# Patient Record
Sex: Male | Born: 1960 | Race: White | Marital: Married | Smoking: Never smoker
Health system: Southern US, Community
[De-identification: ages and names within clinical notes are randomized; demographics above are authoritative.]

## PROBLEM LIST (undated history)

## (undated) DIAGNOSIS — D329 Benign neoplasm of meninges, unspecified: Secondary | ICD-10-CM

## (undated) DIAGNOSIS — Z856 Personal history of leukemia: Secondary | ICD-10-CM

## (undated) HISTORY — DX: Benign neoplasm of meninges, unspecified: D32.9

## (undated) HISTORY — PX: CRANIOTOMY: SHX93

## (undated) HISTORY — DX: Personal history of leukemia: Z85.6

---

## 2015-09-05 ENCOUNTER — Other Ambulatory Visit: Payer: Self-pay | Admitting: Radiation Therapy

## 2015-09-05 DIAGNOSIS — D329 Benign neoplasm of meninges, unspecified: Secondary | ICD-10-CM

## 2015-09-10 ENCOUNTER — Encounter: Payer: Self-pay | Admitting: Radiation Therapy

## 2015-09-10 ENCOUNTER — Other Ambulatory Visit: Payer: Self-pay | Admitting: Radiation Therapy

## 2015-09-10 DIAGNOSIS — D329 Benign neoplasm of meninges, unspecified: Secondary | ICD-10-CM

## 2015-09-10 NOTE — Progress Notes (Signed)
1.  Do you need a wheel chair?  no    2. On oxygen? no  3. Have you ever had any surgery in the body part being scanned?  Surgery 1995 in [place]- done by Dr. Rebeca Allegra  4. Have you ever had any surgery on your brain or heart?                             Yes, surgery in 1995 to remove a meningioma listed in #3   5. Have you ever had surgery on your eyes or ears?  no                    6. Do you have a pacemaker or defibrillator?  no   7. Do you have a Neurostimulator?   no  8. Claustrophobic?  no  9. Any risk for metal in eyes?  no  10. Injury by bullet, buckshot, or shrapnel?   no  11. Stent?      no                                                                                                           12. Hx of Cancer?    None- meningioma                                                                                                             13. Kidney or Liver disease?  no  14. Hx of Lupus, Rheumatoid Arthritis or Scleroderma?  no  15. IV Antibiotics or long term use of NSAIDS?  no  16. HX of Hypertension?  no  17. Diabetes?  Yes- takes metformin   18. Allergy to contrast?   none  19. Recent labs. No   Mont Dutton

## 2015-10-04 ENCOUNTER — Encounter: Payer: Self-pay | Admitting: Radiation Oncology

## 2015-10-04 NOTE — Progress Notes (Signed)
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 Growth seen with yearly scan done by Dr. Kathie Dike. Scheduled for next MRI with Dr. Kathie Dike in April 2017.  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 no  Possible current pregnancy? no  Is the patient on methotrexate? no  Additional Complaints / other details: 54 year old male. Childhood leukemia survivor.

## 2015-10-10 ENCOUNTER — Ambulatory Visit
Admission: RE | Admit: 2015-10-10 | Discharge: 2015-10-10 | Disposition: A | Discharge status: Home / Self Care | Payer: 59 | Source: Ambulatory Visit | Attending: Radiation Oncology | Admitting: Radiation Oncology

## 2015-10-10 DIAGNOSIS — D329 Benign neoplasm of meninges, unspecified: Secondary | ICD-10-CM

## 2015-10-10 MED ORDER — GADOBENATE DIMEGLUMINE 529 MG/ML IV SOLN
16.0000 mL | Freq: Once | INTRAVENOUS | Status: AC | PRN
  Administered 2015-10-10: 16 mL via INTRAVENOUS

## 2015-10-11 ENCOUNTER — Ambulatory Visit
Admission: RE | Admit: 2015-10-11 | Discharge: 2015-10-11 | Disposition: A | Discharge status: Home / Self Care | Payer: 59 | Source: Ambulatory Visit | Attending: Radiation Oncology | Admitting: Radiation Oncology

## 2015-10-11 ENCOUNTER — Ambulatory Visit: Payer: Self-pay

## 2015-10-11 ENCOUNTER — Other Ambulatory Visit: Payer: Self-pay | Admitting: Radiation Therapy

## 2015-10-11 ENCOUNTER — Encounter: Payer: Self-pay | Admitting: Radiation Oncology

## 2015-10-11 VITALS — BP 113/76 | HR 81 | Temp 98.2°F | Resp 16 | Ht 67.0 in | Wt 178.1 lb

## 2015-10-11 DIAGNOSIS — Z510 Encounter for antineoplastic radiation therapy: Secondary | ICD-10-CM | POA: Insufficient documentation

## 2015-10-11 DIAGNOSIS — H0520 Unspecified exophthalmos: Secondary | ICD-10-CM | POA: Insufficient documentation

## 2015-10-11 DIAGNOSIS — D320 Benign neoplasm of cerebral meninges: Secondary | ICD-10-CM

## 2015-10-11 DIAGNOSIS — D329 Benign neoplasm of meninges, unspecified: Secondary | ICD-10-CM | POA: Diagnosis present

## 2015-10-11 DIAGNOSIS — Z856 Personal history of leukemia: Secondary | ICD-10-CM | POA: Diagnosis not present

## 2015-10-11 DIAGNOSIS — Z9889 Other specified postprocedural states: Secondary | ICD-10-CM

## 2015-10-11 DIAGNOSIS — E119 Type 2 diabetes mellitus without complications: Secondary | ICD-10-CM

## 2015-10-11 NOTE — Progress Notes (Signed)
Patient has completed and signed authoization to release health information.

## 2015-10-11 NOTE — Progress Notes (Signed)
Radiation Oncology         [phone] ________________________________  Initial outpatient Consultation  Name: John Glover MRN: [id number]  Date: 10/11/2015  DOB: [birth year]  CC:No primary care provider on file.  Zagar, Kris Hartmann, MD   REFERRING PHYSICIAN: Zagar, Kris Hartmann, MD  DIAGNOSIS: The encounter diagnosis was Meningioma [facility]). John Glover is a 54 year-old gentleman with recurrent left sphenoid wing meningioma.    ICD-9-CM ICD-10-CM   1. Meningioma (HCC) 225.2 D32.9     HISTORY OF PRESENT ILLNESS::John Glover is a very nice 54 y.o. 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 In 1995, while he was living in [place] he underwent surgical resection for a left sphenoid wing meningioma. He has never received radiation. He has been monitored with annual MRI. He relocated to [place] in 2007. He did not undergo brain MRI between 2012 and 2015. The patient was noted to have slow progression of his meningioma by Dr. Alycia Patten of neurosurgery at [facility] and referred to Dr. Lianne Cure of radiation oncology at [facility]. The patient was seen by Dr. Lianne Cure on November 11th. Dr. Lianne Cure recommended radiotherapy using fractionation and has the patient has kindly been referred closer to home for treatment in [place].    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    PAST SURGICAL HISTORY: Past Surgical History  Procedure Laterality Date  . Craniotomy      FAMILY HISTORY: family history includes Diabetes in his mother.  SOCIAL HISTORY:  Social History   Social History  . Marital Status: Married    Spouse Name: N/A  . Number of Children: N/A  . Years of Education: N/A   Occupational History  . Not on file.   Social History Main Topics  . Smoking status: Never Smoker   . Smokeless tobacco: Never Used  . Alcohol Use: No  . Drug Use: No  . Sexual  Activity: Yes   Other Topics Concern  . Not on file   Social History Narrative    ALLERGIES: Review of patient's allergies indicates no known allergies.  MEDICATIONS:  Current Outpatient Prescriptions  Medication Sig Dispense Refill  . metFORMIN (GLUCOPHAGE-XR) 500 MG 24 hr tablet TAKE 1 TABLET BY MOUTH DAILY WITH BREAKFAST    . Vitamin D, Ergocalciferol, (DRISDOL) 50000 units CAPS capsule TAKE 1 CAPSULE (50,000 UNITS TOTAL) BY MOUTH ONCE A WEEK.     No current facility-administered medications for this encounter.    REVIEW OF SYSTEMS:  A 15 point review of systems is documented in the electronic medical record. This was obtained by the nursing staff. However, I reviewed this with the patient to discuss relevant findings and make appropriate changes.  Pertinent items are noted in HPI.   When the patient first noticed his left eye bulging he visited his eye doctor. The bulging is unchanged after surgery. Denies double vision. The patient has no complaints at this time. The patient would prefer to begin radiation treatment after the 15th of January.   PHYSICAL EXAM:  height is 5\' 7"  (1.702 m) and weight is 178 lb 1.6 oz (80.786 kg). His oral temperature is 98.2 F (36.8 C). His blood pressure is 113/76 and his pulse is 81. His respiration is 16 and oxygen saturation is 100%.   per [facility] rad-onc:  General: Well developed, well nourished male in no acute distress HEENT: Pupils equal , round  and reactive. Extra occular movement intact. Slight proptosis on the left side Neuro: CN II-XII intact, AAOx3, gait stable, No dysmetria on exam MSK: Full range of motion in all 4 extremities, normal bulk and tone SKIN:No bruises or rash. Psych: Appropriate mood and affect I concur and would add Left eye proptosis noted. Extraocular muscles intact.  KPS = 90  100 - Normal; no complaints; no evidence of disease. 90   - Able to carry on normal activity; minor signs or symptoms of disease. 80   -  Normal activity with effort; some signs or symptoms of disease. 2   - Cares for self; unable to carry on normal activity or to do active work. 60   - Requires occasional assistance, but is able to care for most of his personal needs. 50   - Requires considerable assistance and frequent medical care. 5   - Disabled; requires special care and assistance. 76   - Severely disabled; hospital admission is indicated although death not imminent. 59   - Very sick; hospital admission necessary; active supportive treatment necessary. 10   - Moribund; fatal processes progressing rapidly. 0     - Dead  Karnofsky DA, Abelmann WH, Craver LS and Burchenal JH [phone]) The use of the nitrogen mustards in the palliative treatment of carcinoma: with particular reference to bronchogenic carcinoma Cancer 1 634-56  LABORATORY DATA:  No results found for: WBC, HGB, HCT, MCV, PLT No results found for: NA, K, CL, CO2 No results found for: ALT, AST, GGT, ALKPHOS, BILITOT   RADIOGRAPHY: Mr Kizzie Fantasia Contrast  10/10/2015  CLINICAL DATA:  54 year old male status post meningioma resection in 2005 with subsequent growth documented on outside [facility] MRI. Planning study for stereotactic radiosurgery treatment. Subsequent encounter. Creatinine was obtained on site at [facility] at [address] Results: Creatinine 1.0 mg/dL. EXAM: MRI HEAD WITHOUT AND WITH CONTRAST TECHNIQUE: Multiplanar, multiecho pulse sequences of the brain and surrounding structures were obtained without and with intravenous contrast. CONTRAST:  87mL MULTIHANCE GADOBENATE DIMEGLUMINE 529 MG/ML IV SOLN COMPARISON:  [facility] MRI brain report 01/31/2015 (no images available). FINDINGS: Left frontotemporal craniotomy changes. Abnormal enhancing, lobular soft tissue extending from the left sphenoid wing medially into the left cavernous sinus and sphenoid paranasal sinus, crossing midline on series 10, image 49. There is involvement are row on the left  orbital apex (best seen on series 10, image 62), as well as extension into the left infratemporal fossa and masticator space. The lesion tracks along the lateral left orbit inseparable from the left lateral rectus muscle (see series 8, image 60 and series 10, image 61). There is left exophthalmos. There is mild stenosis at the left orbital apex. There is lobular anterior extension also suspected along the medial roof of the left maxillary sinus (series 10, image 49 and series 11, image 42) where solid hyper enhancement is different than the surrounding tram track mucosal hyper enhancement pattern related to inflammatory sinus disease. Previous maxillary antrostomy. There is also tumor extension in the left foramen ovale along the 3 (series 10, image 44), partially inseparable from the left ICA siphon. Partially empty sella appearance. Meningioma does extend toward the midline in the sella turcica (series 11, image 30). Tumor extension into the left clivus best seen on series 2, image 22. All told, the tumor encompasses 62 x 65 x 48 mm (AP by transverse by CC). There is superimposed smooth, mild dural thickening and hyper enhancement extending cephalad from the tumor along  the left anterior and lateral convexity (series 10, images 74-85). Major intracranial vascular flow voids are preserved. There is mild mass effect on the left anterior temporal tip, no associated cerebral edema. Mild encephalomalacia in the left greater than right anterior frontal lobes 7, image 15. No other intracranial mass effect. No restricted diffusion or evidence of acute infarction. No midline shift or ventriculomegaly. No acute intracranial hemorrhage identified. There are occasional chronic micro hemorrhages in the brain (posterior right temporal lobe series 6, image 14 and left frontal lobe series 6, image 18). Normal basilar cisterns. Negative cervicomedullary junction and visualized cervical spine. Visible internal auditory structures  appear normal. Mastoids are clear. Superimposed bilateral paranasal sinus mucosal thickening predominantly in the maxillary sinuses. Previous maxillary antrostomies. Negative visualized cervical spinal cord. No acute superficial scalp soft tissue findings. IMPRESSION: 1. Extensive, infiltrating left skullbase meningioma. Extension across midline at the level of the sphenoid paranasal sinus, extension into the left clivus, left foramen ovale, and left masticator space, extension along the left lateral orbit and probably also along the roof of the left maxillary sinus. Tumor overall encompasses 62 x 65 x 48 mm. 2. Superimposed smooth dural thickening and enhancement extending cephalad from the left middle cranial fossa. Overlying previous craniotomy changes. 3. Mild mass effect on the left anterior temporal tip with no cerebral edema identified. Mild postoperative anterior frontal lobe encephalomalacia. Electronically Signed   By: Genevie Ann M.D.   On: 10/10/2015 18:02      IMPRESSION: John Glover is a very nice 54 year-old gentleman with recurrent left sphenoid wing meningioma. The patient may benefit from radiotherapy. Given the size of the target and proximity to optic pathway, the patient would not be a good candidate for single fraction SRS or even hypo-fractionated stereotactic radiation. I would recommend 50.4-52.2 Gy in 28-29 fractions using stereotactic techniques with conventional fractionation.  PLAN: Today, I talked to the patient and family about the findings and work-up thus far.  We discussed the natural history of sphenoid wing meningioma and general treatment, highlighting the role of radiotherapy in the management.  We discussed the available radiation techniques, and focused on the details of logistics and delivery.  We reviewed the anticipated acute and late sequelae associated with radiation in this setting.  The patient was encouraged to ask questions that I answered to the best of my ability.   The patient would like to proceed with radiation and has been scheduled for simulation tomorrow, 12/30.  The patient would prefer to begin radiation treatment after the 15th of January.  I spent 60 minutes minutes face to face with the patient and more than 50% of that time was spent in counseling and/or coordination of care.   ------------------------------------------------  Sheral Apley. Tammi Klippel, M.D.   This document serves as a record of services personally performed by Tyler Pita, MD. It was created on his behalf by Arlyce Harman, a trained medical scribe. The creation of this record is based on the scribe's personal observations and the provider's statements to them. This document has been checked and approved by the attending provider.

## 2015-10-11 NOTE — Progress Notes (Signed)
See progress note under physician encounter. 

## 2015-10-12 ENCOUNTER — Ambulatory Visit
Admission: RE | Admit: 2015-10-12 | Discharge: 2015-10-12 | Disposition: A | Discharge status: Home / Self Care | Payer: 59 | Source: Ambulatory Visit | Attending: Radiation Oncology | Admitting: Radiation Oncology

## 2015-10-12 VITALS — BP 113/76 | HR 85 | Resp 16 | Wt 178.5 lb

## 2015-10-12 DIAGNOSIS — D329 Benign neoplasm of meninges, unspecified: Secondary | ICD-10-CM

## 2015-10-12 DIAGNOSIS — D320 Benign neoplasm of cerebral meninges: Secondary | ICD-10-CM

## 2015-10-12 DIAGNOSIS — Z510 Encounter for antineoplastic radiation therapy: Secondary | ICD-10-CM | POA: Diagnosis not present

## 2015-10-12 LAB — BUN AND CREATININE (CC13)
BUN: 15.5 mg/dL (ref 7.0–26.0)
Creatinine: 1.2 mg/dL (ref 0.7–1.3)
EGFR: 69 mL/min/{1.73_m2} — ABNORMAL LOW (ref >90)

## 2015-10-12 NOTE — Progress Notes (Signed)
  Radiation Oncology         [phone] ________________________________  Name: John Glover MRN: [id number]  Date: 10/12/2015  DOB: 26-Oct-1960  SIMULATION AND TREATMENT PLANNING NOTE    ICD-9-CM ICD-10-CM   1. Left sphenoid wing meningioma involving cavernous sinus (HCC) 225.2 D32.9     DIAGNOSIS:  54 yo man with a recurrent over 6 cm left sphenoid wing meningioma  NARRATIVE:  The patient was brought to the [place].  Identity was confirmed.  All relevant records and images related to the planned course of therapy were reviewed.  The patient freely provided informed written consent to proceed with treatment after reviewing the details related to the planned course of therapy. The consent form was witnessed and verified by the simulation staff. Intravenous access was established for contrast administration. Then, the patient was set-up in a stable reproducible supine position for radiation therapy.  A relocatable thermoplastic stereotactic head frame was fabricated for precise immobilization.  CT images were obtained.  Surface markings were placed.  The CT images were loaded into the planning software and fused with the patient's targeting MRI scan.  Then the target and avoidance structures were contoured.  Treatment planning then occurred.  The radiation prescription was entered and confirmed.  I have requested 3D planning  I have requested a DVH of the following structures: Brain stem, brain, left eye, right eye, lenses, optic chiasm, target volumes, uninvolved brain, and normal tissue.    PLAN:  The patient will receive 52.2 Gy in 29 fraction.  ________________________________  Sheral Apley Tammi Klippel, M.D.

## 2015-10-16 ENCOUNTER — Telehealth: Payer: Self-pay | Admitting: Radiation Oncology

## 2015-10-16 ENCOUNTER — Ambulatory Visit
Admission: RE | Admit: 2015-10-16 | Discharge: 2015-10-16 | Disposition: A | Discharge status: Home / Self Care | Payer: 59 | Source: Ambulatory Visit | Attending: Radiation Oncology | Admitting: Radiation Oncology

## 2015-10-16 DIAGNOSIS — D320 Benign neoplasm of cerebral meninges: Secondary | ICD-10-CM

## 2015-10-16 DIAGNOSIS — Z510 Encounter for antineoplastic radiation therapy: Secondary | ICD-10-CM | POA: Diagnosis not present

## 2015-10-16 LAB — BUN AND CREATININE (CC13)
BUN: 15.6 mg/dL (ref 7.0–26.0)
Creatinine: 1.1 mg/dL (ref 0.7–1.3)
EGFR: 74 mL/min/{1.73_m2} — ABNORMAL LOW (ref >90)

## 2015-10-16 MED ORDER — SODIUM CHLORIDE 0.9 % IJ SOLN
10.0000 mL | Freq: Once | INTRAMUSCULAR | Status: AC
  Administered 2015-10-16: 10 mL via INTRAVENOUS

## 2015-10-16 NOTE — Progress Notes (Signed)
Received patient in the clinic for IV start necessary for [facility] simulation. BUN 24 and creatinine 0.9. Patient denies taking Metformin today. Patient verbalized understanding to not resume Metformin until after subsequent lab drawn on Tuesday, January 3rd. Started right AC 22 gauge IV on first attempt. Site flushed without difficulty and excellent blood return obtained. Secured IV in place. Escorted patient to CT for simulation. Following simulation IV was removed by Mont Dutton, RT. Manuela Schwartz reports the IV catheter was intact upon removal and she applied an occlusive dressing to the old site. Manuela Schwartz reports the patient tolerated this well.

## 2015-10-16 NOTE — Telephone Encounter (Signed)
Phoned patient making him aware his BUN and creatinine are WDL and he may resume his Metformin. Patient verbalized understanding and expressed appreciation for the call.

## 2015-10-22 ENCOUNTER — Other Ambulatory Visit: Payer: Self-pay | Admitting: Radiation Therapy

## 2015-10-22 DIAGNOSIS — D329 Benign neoplasm of meninges, unspecified: Secondary | ICD-10-CM

## 2015-10-26 ENCOUNTER — Other Ambulatory Visit: Payer: Self-pay | Admitting: Radiation Oncology

## 2015-10-28 ENCOUNTER — Ambulatory Visit
Admission: RE | Admit: 2015-10-28 | Discharge: 2015-10-28 | Disposition: A | Discharge status: Home / Self Care | Payer: 59 | Source: Ambulatory Visit | Attending: Radiation Oncology | Admitting: Radiation Oncology

## 2015-10-28 ENCOUNTER — Other Ambulatory Visit: Payer: 59

## 2015-10-28 DIAGNOSIS — D329 Benign neoplasm of meninges, unspecified: Secondary | ICD-10-CM

## 2015-10-28 MED ORDER — GADOBENATE DIMEGLUMINE 529 MG/ML IV SOLN
15.0000 mL | Freq: Once | INTRAVENOUS | Status: AC | PRN
  Administered 2015-10-28: 15 mL via INTRAVENOUS

## 2015-10-29 ENCOUNTER — Ambulatory Visit: Payer: 59 | Admitting: Radiation Oncology

## 2015-10-29 ENCOUNTER — Ambulatory Visit: Admission: RE | Admit: 2015-10-29 | Payer: 59 | Source: Ambulatory Visit | Admitting: Radiation Oncology

## 2015-10-30 ENCOUNTER — Ambulatory Visit: Payer: 59

## 2015-10-31 ENCOUNTER — Ambulatory Visit: Payer: 59

## 2015-10-31 DIAGNOSIS — Z510 Encounter for antineoplastic radiation therapy: Secondary | ICD-10-CM | POA: Diagnosis not present

## 2015-11-01 ENCOUNTER — Ambulatory Visit
Admission: RE | Admit: 2015-11-01 | Discharge: 2015-11-01 | Disposition: A | Discharge status: Home / Self Care | Payer: 59 | Source: Ambulatory Visit | Attending: Radiation Oncology | Admitting: Radiation Oncology

## 2015-11-01 VITALS — BP 112/80 | HR 65 | Resp 16 | Wt 179.1 lb

## 2015-11-01 DIAGNOSIS — Z510 Encounter for antineoplastic radiation therapy: Secondary | ICD-10-CM | POA: Diagnosis not present

## 2015-11-01 DIAGNOSIS — D329 Benign neoplasm of meninges, unspecified: Secondary | ICD-10-CM

## 2015-11-01 NOTE — Progress Notes (Signed)
  Radiation Oncology         [phone]   Name: John Glover MRN: [id number]   Date: 11/01/2015  DOB: 03-14-1961   Weekly Radiation Therapy Management    ICD-9-CM ICD-10-CM   1. Left sphenoid wing meningioma involving cavernous sinus (HCC) 225.2 D32.9     Current Dose: 1.8 Gy  Planned Dose:  54 Gy  Narrative The patient presents for routine under treatment assessment.  Weight and vitals stable. Denies pain. Reports experiencing a severe headache during treatment but, relates this to the mask. Denies nausea or vomiting. Denies diplopia or blurry vision. Left eye more prominent than right. Drooping of left eyelid continues. No skin changes within treatment field. Denies fatigue.      The patient is without complaint. Set-up films were reviewed. The chart was checked.  Physical Findings  weight is 179 lb 1.6 oz (81.239 kg). His blood pressure is 112/80 and his pulse is 65. His respiration is 16 and oxygen saturation is 100%. . Weight essentially stable.  No significant changes.  Impression The patient is tolerating radiation.  Plan Continue treatment as planned.       Sheral Apley Tammi Klippel, M.D.  This document serves as a record of services personally performed by Tyler Pita, MD. It was created on his behalf by Jenell Milliner, a trained medical scribe. The creation of this record is based on the scribe's personal observations and the provider's statements to them. This document has been checked and approved by the attending provider.

## 2015-11-01 NOTE — Progress Notes (Addendum)
Weight and vitals stable. Denies pain. Reports experiencing a severe headache during treatment but, relates this to the mask. Denies nausea or vomiting. Denies diplopia or blurry vision. Left eye more prominent than right. Drooping of left eyelid continues. No skin changes within treatment field. Denies fatigue. Oriented patient to staff and routine of the clinic. Provided patient with RADIATION THERAPY AND YOU handbook then, reviewed pertinent information. Educated patient reference potential side effects and management. Answered all patient questions to the best of my ability. Provided patient with my business card and encouraged him to call with needs. Patient verbalized understanding of all reviewed.   BP 112/80 mmHg  Pulse 65  Resp 16  Wt 179 lb 1.6 oz (81.239 kg)  SpO2 100% Wt Readings from Last 3 Encounters:  11/01/15 179 lb 1.6 oz (81.239 kg)  10/12/15 178 lb 8 oz (80.967 kg)  10/11/15 178 lb 1.6 oz (80.786 kg)

## 2015-11-02 ENCOUNTER — Ambulatory Visit
Admission: RE | Admit: 2015-11-02 | Discharge: 2015-11-02 | Disposition: A | Discharge status: Home / Self Care | Payer: 59 | Source: Ambulatory Visit | Attending: Radiation Oncology | Admitting: Radiation Oncology

## 2015-11-02 DIAGNOSIS — Z510 Encounter for antineoplastic radiation therapy: Secondary | ICD-10-CM | POA: Diagnosis not present

## 2015-11-05 ENCOUNTER — Ambulatory Visit
Admission: RE | Admit: 2015-11-05 | Discharge: 2015-11-05 | Disposition: A | Discharge status: Home / Self Care | Payer: 59 | Source: Ambulatory Visit | Attending: Radiation Oncology | Admitting: Radiation Oncology

## 2015-11-05 ENCOUNTER — Ambulatory Visit: Payer: 59

## 2015-11-06 ENCOUNTER — Telehealth (HOSPITAL_COMMUNITY): Payer: Self-pay

## 2015-11-06 ENCOUNTER — Ambulatory Visit
Admission: RE | Admit: 2015-11-06 | Discharge: 2015-11-06 | Disposition: A | Discharge status: Home / Self Care | Payer: 59 | Source: Ambulatory Visit | Attending: Radiation Oncology | Admitting: Radiation Oncology

## 2015-11-06 DIAGNOSIS — Z510 Encounter for antineoplastic radiation therapy: Secondary | ICD-10-CM | POA: Diagnosis not present

## 2015-11-06 NOTE — Telephone Encounter (Signed)
11/06/15        Patient called and left msg. cancelling Dental Consult w/Dr. Enrique Sack on 11/07/15 @ 10:30 due to work schedule.  Patient will call back later next week to reschedule for another appt. date.   LRI

## 2015-11-07 ENCOUNTER — Other Ambulatory Visit (HOSPITAL_COMMUNITY): Payer: 59 | Admitting: Dentistry

## 2015-11-07 ENCOUNTER — Ambulatory Visit
Admission: RE | Admit: 2015-11-07 | Discharge: 2015-11-07 | Disposition: A | Discharge status: Home / Self Care | Payer: 59 | Source: Ambulatory Visit | Attending: Radiation Oncology | Admitting: Radiation Oncology

## 2015-11-07 DIAGNOSIS — Z510 Encounter for antineoplastic radiation therapy: Secondary | ICD-10-CM | POA: Diagnosis not present

## 2015-11-08 ENCOUNTER — Ambulatory Visit
Admission: RE | Admit: 2015-11-08 | Discharge: 2015-11-08 | Disposition: A | Discharge status: Home / Self Care | Payer: 59 | Source: Ambulatory Visit | Attending: Radiation Oncology | Admitting: Radiation Oncology

## 2015-11-08 ENCOUNTER — Encounter: Payer: Self-pay | Admitting: Radiation Oncology

## 2015-11-08 VITALS — BP 116/87 | HR 89 | Temp 98.7°F | Resp 16 | Wt 176.5 lb

## 2015-11-08 DIAGNOSIS — Z510 Encounter for antineoplastic radiation therapy: Secondary | ICD-10-CM | POA: Diagnosis not present

## 2015-11-08 DIAGNOSIS — D329 Benign neoplasm of meninges, unspecified: Secondary | ICD-10-CM

## 2015-11-08 NOTE — Progress Notes (Signed)
wekly rad txs brain 5/30 completed, no nausea, head aches or vision changes , mask tight and  still hits his tooth, left side face slightly swollen,left eye reddened, no pain, no fatigue,. Asked aboput referral to Dr. Enrique Sack office, not taking dexamethasone BP 116/87 mmHg  Pulse 89  Temp(Src) 98.7 F (37.1 C) (Oral)  Resp 16  Wt 176 lb 8 oz (80.06 kg)  SpO2 100%  Wt Readings from Last 3 Encounters:  11/08/15 176 lb 8 oz (80.06 kg)  11/01/15 179 lb 1.6 oz (81.239 kg)  10/12/15 178 lb 8 oz (80.967 kg)  10:14 AM

## 2015-11-08 NOTE — Progress Notes (Signed)
  Radiation Oncology         [phone]   Name: John Glover MRN: [id number]   Date: 11/08/2015  DOB: 03-Aug-1961   Weekly Radiation Therapy Management    ICD-9-CM ICD-10-CM   1. Left sphenoid wing meningioma involving cavernous sinus (HCC) 225.2 D32.9     Current Dose: 9 Gy  Planned Dose:  54 Gy  Narrative The patient presents for routine under treatment assessment.  Patient reports slight warmth, redness, and swelling on the left side of the face and eye. Also reports that mask is tight and still hits his tooth. Denies pain, fatigue, nausea, headaches, and vision changes. Asked about referral to Dr. Ritta Slot office. Patient is not taking dexamethasone  Set-up films were reviewed. The chart was checked.  Physical Findings  weight is 176 lb 8 oz (80.06 kg). His oral temperature is 98.7 F (37.1 C). His blood pressure is 116/87 and his pulse is 89. His respiration is 16 and oxygen saturation is 100%.  Weight essentially stable.  No significant changes.  Impression The patient is tolerating radiation.  Plan Continue treatment as planned. Will refer to Dr. Enrique Sack.      Sheral Apley Tammi Klippel, M.D.  This document serves as a record of services personally performed by Tyler Pita, MD. It was created on his behalf by Jenell Milliner, a trained medical scribe. The creation of this record is based on the scribe's personal observations and the provider's statements to them. This document has been checked and approved by the attending provider.

## 2015-11-09 ENCOUNTER — Ambulatory Visit
Admission: RE | Admit: 2015-11-09 | Discharge: 2015-11-09 | Disposition: A | Discharge status: Home / Self Care | Payer: 59 | Source: Ambulatory Visit | Attending: Radiation Oncology | Admitting: Radiation Oncology

## 2015-11-09 DIAGNOSIS — Z510 Encounter for antineoplastic radiation therapy: Secondary | ICD-10-CM | POA: Diagnosis not present

## 2015-11-12 ENCOUNTER — Ambulatory Visit
Admission: RE | Admit: 2015-11-12 | Discharge: 2015-11-12 | Disposition: A | Discharge status: Home / Self Care | Payer: 59 | Source: Ambulatory Visit | Attending: Radiation Oncology | Admitting: Radiation Oncology

## 2015-11-12 DIAGNOSIS — Z510 Encounter for antineoplastic radiation therapy: Secondary | ICD-10-CM | POA: Diagnosis not present

## 2015-11-13 ENCOUNTER — Ambulatory Visit
Admission: RE | Admit: 2015-11-13 | Discharge: 2015-11-13 | Disposition: A | Discharge status: Home / Self Care | Payer: 59 | Source: Ambulatory Visit | Attending: Radiation Oncology | Admitting: Radiation Oncology

## 2015-11-13 DIAGNOSIS — Z510 Encounter for antineoplastic radiation therapy: Secondary | ICD-10-CM | POA: Diagnosis not present

## 2015-11-14 ENCOUNTER — Ambulatory Visit
Admission: RE | Admit: 2015-11-14 | Discharge: 2015-11-14 | Disposition: A | Discharge status: Home / Self Care | Payer: 59 | Source: Ambulatory Visit | Attending: Radiation Oncology | Admitting: Radiation Oncology

## 2015-11-14 DIAGNOSIS — Z510 Encounter for antineoplastic radiation therapy: Secondary | ICD-10-CM | POA: Diagnosis not present

## 2015-11-15 ENCOUNTER — Ambulatory Visit
Admission: RE | Admit: 2015-11-15 | Discharge: 2015-11-15 | Disposition: A | Discharge status: Home / Self Care | Payer: 59 | Source: Ambulatory Visit | Attending: Radiation Oncology | Admitting: Radiation Oncology

## 2015-11-15 DIAGNOSIS — Z510 Encounter for antineoplastic radiation therapy: Secondary | ICD-10-CM | POA: Diagnosis not present

## 2015-11-15 NOTE — Addendum Note (Signed)
Encounter addended by: Heywood Footman, RN on: 11/15/2015 10:21 AM<BR>     Documentation filed: Notes Section, Chief Complaint Section

## 2015-11-16 ENCOUNTER — Ambulatory Visit
Admission: RE | Admit: 2015-11-16 | Discharge: 2015-11-16 | Disposition: A | Discharge status: Home / Self Care | Payer: 59 | Source: Ambulatory Visit | Attending: Radiation Oncology | Admitting: Radiation Oncology

## 2015-11-16 VITALS — BP 110/79 | HR 71 | Resp 16 | Wt 175.8 lb

## 2015-11-16 DIAGNOSIS — D329 Benign neoplasm of meninges, unspecified: Secondary | ICD-10-CM | POA: Insufficient documentation

## 2015-11-16 DIAGNOSIS — Z510 Encounter for antineoplastic radiation therapy: Secondary | ICD-10-CM | POA: Insufficient documentation

## 2015-11-16 MED ORDER — SONAFINE EX EMUL
1.0000 "application " | Freq: Two times a day (BID) | CUTANEOUS | Status: DC
  Administered 2015-11-16: 1 via TOPICAL
  Filled 2015-11-16: qty 45

## 2015-11-16 NOTE — Progress Notes (Signed)
Weight and vitals stable. Denies pain. Reports following radiation treatment most days he experiences a headache. He attributes this to the tight fit of the treatment mask. Reports he manages these headaches with motrin. Reports new onset of taste changes. Reports tearing from his left eye. Hyperpigmentation without desquamation left cheek. Provided patient with Sonafine cream and directed upon use. Denies dizziness or ringing in his ears. Reports fatigue. Denies taking decadron.   BP 110/79 mmHg  Pulse 71  Resp 16  Wt 175 lb 12.8 oz (79.742 kg)  SpO2 100% Wt Readings from Last 3 Encounters:  11/16/15 175 lb 12.8 oz (79.742 kg)  11/08/15 176 lb 8 oz (80.06 kg)  11/01/15 179 lb 1.6 oz (81.239 kg)

## 2015-11-16 NOTE — Progress Notes (Signed)
  Radiation Oncology         [phone]   Name: John Glover MRN: [id number]   Date: 11/16/2015  DOB: [birth year]     Weekly Radiation Therapy Management    ICD-9-CM ICD-10-CM   1. Left sphenoid wing meningioma involving cavernous sinus (HCC) 225.2 D32.9 DISCONTINUED: SONAFINE emulsion 1 application    Current Dose: 19.8 Gy  Planned Dose:  54 Gy  Narrative The patient presents for routine under treatment assessment.  Weight and vitals stable. Denies pain. Reports following radiation treatment most days he experiences a headache. He attributes this to the tight fit of the treatment mask. Reports he manages these headaches with motrin. Reports new onset of taste changes, decreased taste of salt or sweet. Reports tearing from his left eye. Hyperpigmentation without desquamation left cheek. Provided patient with Sonafine cream and directed upon use. Denies dizziness or ringing in his ears. Reports fatigue. Denies taking decadron. Reports slight dry mouth.  Set-up films were reviewed. The chart was checked.  Physical Findings  weight is 175 lb 12.8 oz (79.742 kg). His blood pressure is 110/79 and his pulse is 71. His respiration is 16 and oxygen saturation is 100%.  Weight essentially stable.  No significant changes. No thrush noted.  Impression The patient is tolerating radiation.  Plan Continue treatment as planned.       Sheral Apley Tammi Klippel, M.D.  This document serves as a record of services personally performed by Tyler Pita, MD. It was created on his behalf by Arlyce Harman, a trained medical scribe. The creation of this record is based on the scribe's personal observations and the provider's statements to them. This document has been checked and approved by the attending provider.

## 2015-11-19 ENCOUNTER — Ambulatory Visit
Admission: RE | Admit: 2015-11-19 | Discharge: 2015-11-19 | Disposition: A | Discharge status: Home / Self Care | Payer: 59 | Source: Ambulatory Visit | Attending: Radiation Oncology | Admitting: Radiation Oncology

## 2015-11-19 DIAGNOSIS — Z510 Encounter for antineoplastic radiation therapy: Secondary | ICD-10-CM | POA: Diagnosis not present

## 2015-11-20 ENCOUNTER — Ambulatory Visit
Admission: RE | Admit: 2015-11-20 | Discharge: 2015-11-20 | Disposition: A | Discharge status: Home / Self Care | Payer: 59 | Source: Ambulatory Visit | Attending: Radiation Oncology | Admitting: Radiation Oncology

## 2015-11-20 DIAGNOSIS — Z510 Encounter for antineoplastic radiation therapy: Secondary | ICD-10-CM | POA: Diagnosis not present

## 2015-11-21 ENCOUNTER — Ambulatory Visit
Admission: RE | Admit: 2015-11-21 | Discharge: 2015-11-21 | Disposition: A | Discharge status: Home / Self Care | Payer: 59 | Source: Ambulatory Visit | Attending: Radiation Oncology | Admitting: Radiation Oncology

## 2015-11-21 DIAGNOSIS — Z510 Encounter for antineoplastic radiation therapy: Secondary | ICD-10-CM | POA: Diagnosis not present

## 2015-11-22 ENCOUNTER — Ambulatory Visit
Admission: RE | Admit: 2015-11-22 | Discharge: 2015-11-22 | Disposition: A | Discharge status: Home / Self Care | Payer: 59 | Source: Ambulatory Visit | Attending: Radiation Oncology | Admitting: Radiation Oncology

## 2015-11-22 VITALS — BP 119/74 | HR 71 | Resp 16 | Wt 172.5 lb

## 2015-11-22 DIAGNOSIS — D329 Benign neoplasm of meninges, unspecified: Secondary | ICD-10-CM

## 2015-11-22 DIAGNOSIS — Z510 Encounter for antineoplastic radiation therapy: Secondary | ICD-10-CM | POA: Diagnosis not present

## 2015-11-22 NOTE — Progress Notes (Signed)
Vitals stable. Weight loss noted associated with lack of appetite from taste changes. Denies pain. Hyperpigmentation without desquamation noted surrounding left eye. Reports left eye dryness. Encouraged patient to instill Genteal OTC. Reports headaches s/p radiation have subsided. Alopecia at hair line mild. Denies dizziness or ringing in his ears. Reports fatigue.   BP 119/74 mmHg  Pulse 71  Resp 16  Wt 172 lb 8 oz (78.245 kg)  SpO2 100% Wt Readings from Last 3 Encounters:  11/22/15 172 lb 8 oz (78.245 kg)  11/16/15 175 lb 12.8 oz (79.742 kg)  11/08/15 176 lb 8 oz (80.06 kg)

## 2015-11-22 NOTE — Progress Notes (Signed)
  Radiation Oncology         [phone]   Name: John Glover MRN: [id number]   Date: 11/22/2015  DOB: 10/03/1961     Weekly Radiation Therapy Management    ICD-9-CM ICD-10-CM   1. 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  Vitals stable. Weight loss noted associated with lack of appetite from taste changes. Denies pain. Hyperpigmentation without desquamation noted surrounding left eye. Reports left eye dryness. Reports headaches s/p radiation have subsided. Alopecia at hair line mild. Denies dizziness or ringing in his ears. Reports fatigue.   Set-up films were reviewed. The chart was checked.  Physical Findings  weight is 172 lb 8 oz (78.245 kg). His blood pressure is 119/74 and his pulse is 71. His respiration is 16 and oxygen saturation is 100%.  Weight essentially stable.  No significant changes. No thrush noted.  Impression The patient is tolerating radiation.  Plan Continue treatment as planned.  Encouraged patient to instill Genteal OTC.    Sheral Apley Tammi Klippel, M.D.  This document serves as a record of services personally performed by Tyler Pita, MD. It was created on his behalf by Derek Mound, a trained medical scribe. The creation of this record is based on the scribe's personal observations and the provider's statements to them. This document has been checked and approved by the attending provider.

## 2015-11-23 ENCOUNTER — Ambulatory Visit
Admission: RE | Admit: 2015-11-23 | Discharge: 2015-11-23 | Disposition: A | Discharge status: Home / Self Care | Payer: 59 | Source: Ambulatory Visit | Attending: Radiation Oncology | Admitting: Radiation Oncology

## 2015-11-23 DIAGNOSIS — Z510 Encounter for antineoplastic radiation therapy: Secondary | ICD-10-CM | POA: Diagnosis not present

## 2015-11-26 ENCOUNTER — Ambulatory Visit
Admission: RE | Admit: 2015-11-26 | Discharge: 2015-11-26 | Disposition: A | Discharge status: Home / Self Care | Payer: 59 | Source: Ambulatory Visit | Attending: Radiation Oncology | Admitting: Radiation Oncology

## 2015-11-26 DIAGNOSIS — Z510 Encounter for antineoplastic radiation therapy: Secondary | ICD-10-CM | POA: Diagnosis not present

## 2015-11-27 ENCOUNTER — Ambulatory Visit
Admission: RE | Admit: 2015-11-27 | Discharge: 2015-11-27 | Disposition: A | Discharge status: Home / Self Care | Payer: 59 | Source: Ambulatory Visit | Attending: Radiation Oncology | Admitting: Radiation Oncology

## 2015-11-27 DIAGNOSIS — Z510 Encounter for antineoplastic radiation therapy: Secondary | ICD-10-CM | POA: Diagnosis not present

## 2015-11-28 ENCOUNTER — Ambulatory Visit
Admission: RE | Admit: 2015-11-28 | Discharge: 2015-11-28 | Disposition: A | Discharge status: Home / Self Care | Payer: 59 | Source: Ambulatory Visit | Attending: Radiation Oncology | Admitting: Radiation Oncology

## 2015-11-28 DIAGNOSIS — Z510 Encounter for antineoplastic radiation therapy: Secondary | ICD-10-CM | POA: Diagnosis not present

## 2015-11-29 ENCOUNTER — Ambulatory Visit
Admission: RE | Admit: 2015-11-29 | Discharge: 2015-11-29 | Disposition: A | Discharge status: Home / Self Care | Payer: 59 | Source: Ambulatory Visit | Attending: Radiation Oncology | Admitting: Radiation Oncology

## 2015-11-29 VITALS — BP 106/71 | HR 73 | Temp 98.4°F | Resp 18 | Wt 169.3 lb

## 2015-11-29 DIAGNOSIS — D329 Benign neoplasm of meninges, unspecified: Secondary | ICD-10-CM

## 2015-11-29 DIAGNOSIS — Z510 Encounter for antineoplastic radiation therapy: Secondary | ICD-10-CM | POA: Diagnosis not present

## 2015-11-29 NOTE — Progress Notes (Signed)
Weekly rad tx brain txs left side face  20/30 completed,   Left side face erythema dry and flaky, using radiaplex gel daily,  No nausea, no pain, no vision changes, appetite fair too poor, no taste in foods,   Eats chicken soups, drinks carnation instant drinks 1  daily  No pain  BP 106/71 mmHg  Pulse 73  Temp(Src) 98.4 F (36.9 C) (Oral)  Resp 18  Wt 169 lb 4.8 oz (76.794 kg)  Wt Readings from Last 3 Encounters:  11/29/15 169 lb 4.8 oz (76.794 kg)  11/22/15 172 lb 8 oz (78.245 kg)  11/16/15 175 lb 12.8 oz (79.742 kg)

## 2015-11-29 NOTE — Progress Notes (Signed)
Wt Readings from Last 3 Encounters:  11/22/15 172 lb 8 oz (78.245 kg)  11/16/15 175 lb 12.8 oz (79.742 kg)  11/08/15 176 lb 8 oz (80.06 kg)

## 2015-11-29 NOTE — Progress Notes (Signed)
  Radiation Oncology         [phone]   Name: Nethanel Steltenpohl MRN: [id number]   Date: 11/29/2015  DOB: 1961-09-28     Weekly Radiation Therapy Management    ICD-9-CM ICD-10-CM   1. 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  20/30 fractions completed. He is using radiaplex gel bid to the treatment area. Denies nausea, pain, or vision change. Reports a fair appetite. He has reports poor taste. Eats chicken soup and drinks a carnation instant drink daily. He reports his left eye feels dry and is using Genteal Eyedrops OTC for it.  Set-up films were reviewed. The chart was checked.  Physical Findings  weight is 169 lb 4.8 oz (76.794 kg). His oral temperature is 98.4 F (36.9 C). His blood pressure is 106/71 and his pulse is 73. His respiration is 18.    The patient's left face is notable for erythema and dry changes with minimal early desquamation.  Impression The patient is tolerating radiation. The patient is developing a skin reaction.  Plan Continue treatment as planned.  Encouraged patient to continue the Genteal OTC. I encouraged the patient to use Radiaplex from bid to tid (applying after taking a shower in the morning, after treatment, and before bed).    Sheral Apley Tammi Klippel, M.D.  This document serves as a record of services personally performed by Tyler Pita, MD. It was created on his behalf by Darcus Austin, a trained medical scribe. The creation of this record is based on the scribe's personal observations and the provider's statements to them. This document has been checked and approved by the attending provider.

## 2015-11-30 ENCOUNTER — Encounter: Payer: Self-pay | Admitting: Radiation Oncology

## 2015-11-30 ENCOUNTER — Ambulatory Visit
Admission: RE | Admit: 2015-11-30 | Discharge: 2015-11-30 | Disposition: A | Discharge status: Home / Self Care | Payer: 59 | Source: Ambulatory Visit | Attending: Radiation Oncology | Admitting: Radiation Oncology

## 2015-11-30 DIAGNOSIS — Z510 Encounter for antineoplastic radiation therapy: Secondary | ICD-10-CM | POA: Diagnosis not present

## 2015-12-03 ENCOUNTER — Ambulatory Visit
Admission: RE | Admit: 2015-12-03 | Discharge: 2015-12-03 | Disposition: A | Discharge status: Home / Self Care | Payer: 59 | Source: Ambulatory Visit | Attending: Radiation Oncology | Admitting: Radiation Oncology

## 2015-12-03 DIAGNOSIS — Z510 Encounter for antineoplastic radiation therapy: Secondary | ICD-10-CM | POA: Diagnosis not present

## 2015-12-04 ENCOUNTER — Ambulatory Visit
Admission: RE | Admit: 2015-12-04 | Discharge: 2015-12-04 | Disposition: A | Discharge status: Home / Self Care | Payer: 59 | Source: Ambulatory Visit | Attending: Radiation Oncology | Admitting: Radiation Oncology

## 2015-12-04 DIAGNOSIS — Z510 Encounter for antineoplastic radiation therapy: Secondary | ICD-10-CM | POA: Diagnosis not present

## 2015-12-05 ENCOUNTER — Ambulatory Visit
Admission: RE | Admit: 2015-12-05 | Discharge: 2015-12-05 | Disposition: A | Discharge status: Home / Self Care | Payer: 59 | Source: Ambulatory Visit | Attending: Radiation Oncology | Admitting: Radiation Oncology

## 2015-12-05 DIAGNOSIS — Z510 Encounter for antineoplastic radiation therapy: Secondary | ICD-10-CM | POA: Diagnosis not present

## 2015-12-06 ENCOUNTER — Ambulatory Visit
Admission: RE | Admit: 2015-12-06 | Discharge: 2015-12-06 | Disposition: A | Discharge status: Home / Self Care | Payer: 59 | Source: Ambulatory Visit | Attending: Radiation Oncology | Admitting: Radiation Oncology

## 2015-12-06 ENCOUNTER — Ambulatory Visit: Payer: 59

## 2015-12-06 ENCOUNTER — Encounter: Payer: Self-pay | Admitting: Radiation Oncology

## 2015-12-06 VITALS — BP 117/81 | HR 86 | Temp 98.6°F | Ht 67.0 in | Wt 168.1 lb

## 2015-12-06 DIAGNOSIS — D329 Benign neoplasm of meninges, unspecified: Secondary | ICD-10-CM

## 2015-12-06 DIAGNOSIS — Z510 Encounter for antineoplastic radiation therapy: Secondary | ICD-10-CM | POA: Diagnosis not present

## 2015-12-06 NOTE — Progress Notes (Signed)
  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  25/30 fractions completed. He denies pain, but reports a sensitive Left inner ear area related to radiation. The left side of his face is hyperpigmented and slightly dry, and he is using the radiaplex cream twice daily. He did have an area that was open to his Left outer eye which he used neosporin. The neosporin has helped. He is using Gentil eye drops in his left eye to prevent eye watering at night. His tongue is coated white, and he reports taste changes related to radiation and thick saliva. He has difficulty eating because of these taste changes, and eats mostly liquid foods including soups. He is drinking [employer] twice daily to supplement his intake.     Set-up films were reviewed. The chart was checked.  Physical Findings  height is 5\' 7"  (1.702 m) and weight is 168 lb 1.6 oz (76.25 kg). His temperature is 98.6 F (37 C). His blood pressure is 117/81 and his pulse is 86. His oxygen saturation is 100%.    The patient's left face is notable for erythema and dry changes with minimal early desquamation.  On exam, no thrush is noted.   Impression The patient is tolerating radiation. The patient has developed a skin reaction.  Plan Continue treatment as planned.    Sheral Apley Tammi Klippel, M.D.  This document serves as a record of services personally performed by Tyler Pita, MD. It was created on his behalf by Jenell Milliner, a trained medical scribe. The creation of this record is based on the scribe's personal observations and the provider's statements to them. This document has been checked and approved by the attending  provider.

## 2015-12-06 NOTE — Progress Notes (Signed)
John Glover presents for his 25th fraction of radiation to his Brain. He denies pain, but reports a sensitive Left inner ear area related to radiation. The left side of his face is hyperpigmented and slightly dry, and he is using the radiaplex cream twice daily.  He did have an area that was open to his Left outer eye which he used neosporin which has helped. He is using Gentil eye drops in his left eye to prevent eye watering at night. His tongue is coated white, and he reports taste changes related to radiation and thick saliva. He has difficulty eating because of these taste changes, and eats mostly liquid foods including soups. He is drinking carnation instant breakfasts twice daily to supplement his intake.   BP 117/81 mmHg  Pulse 86  Temp(Src) 98.6 F (37 C)  Ht 5\' 7"  (1.702 m)  Wt 168 lb 1.6 oz (76.25 kg)  BMI 26.32 kg/m2  SpO2 100%   Wt Readings from Last 3 Encounters:  12/06/15 168 lb 1.6 oz (76.25 kg)  11/29/15 169 lb 4.8 oz (76.794 kg)  11/22/15 172 lb 8 oz (78.245 kg)

## 2015-12-07 ENCOUNTER — Ambulatory Visit: Payer: 59 | Admitting: Radiation Oncology

## 2015-12-07 ENCOUNTER — Ambulatory Visit
Admission: RE | Admit: 2015-12-07 | Discharge: 2015-12-07 | Disposition: A | Discharge status: Home / Self Care | Payer: 59 | Source: Ambulatory Visit | Attending: Radiation Oncology | Admitting: Radiation Oncology

## 2015-12-07 DIAGNOSIS — Z510 Encounter for antineoplastic radiation therapy: Secondary | ICD-10-CM | POA: Diagnosis not present

## 2015-12-10 ENCOUNTER — Ambulatory Visit
Admission: RE | Admit: 2015-12-10 | Discharge: 2015-12-10 | Disposition: A | Discharge status: Home / Self Care | Payer: 59 | Source: Ambulatory Visit | Attending: Radiation Oncology | Admitting: Radiation Oncology

## 2015-12-10 DIAGNOSIS — Z510 Encounter for antineoplastic radiation therapy: Secondary | ICD-10-CM | POA: Diagnosis not present

## 2015-12-11 ENCOUNTER — Ambulatory Visit: Payer: 59

## 2015-12-11 ENCOUNTER — Ambulatory Visit
Admission: RE | Admit: 2015-12-11 | Discharge: 2015-12-11 | Disposition: A | Discharge status: Home / Self Care | Payer: 59 | Source: Ambulatory Visit | Attending: Radiation Oncology | Admitting: Radiation Oncology

## 2015-12-11 DIAGNOSIS — Z510 Encounter for antineoplastic radiation therapy: Secondary | ICD-10-CM | POA: Diagnosis not present

## 2015-12-12 ENCOUNTER — Ambulatory Visit
Admission: RE | Admit: 2015-12-12 | Discharge: 2015-12-12 | Disposition: A | Discharge status: Home / Self Care | Payer: 59 | Source: Ambulatory Visit | Attending: Radiation Oncology | Admitting: Radiation Oncology

## 2015-12-12 ENCOUNTER — Ambulatory Visit: Payer: 59

## 2015-12-12 DIAGNOSIS — Z510 Encounter for antineoplastic radiation therapy: Secondary | ICD-10-CM | POA: Diagnosis not present

## 2015-12-13 ENCOUNTER — Ambulatory Visit
Admission: RE | Admit: 2015-12-13 | Discharge: 2015-12-13 | Disposition: A | Discharge status: Home / Self Care | Payer: 59 | Source: Ambulatory Visit | Attending: Radiation Oncology | Admitting: Radiation Oncology

## 2015-12-13 ENCOUNTER — Encounter: Payer: Self-pay | Admitting: Radiation Oncology

## 2015-12-13 VITALS — BP 109/76 | HR 82 | Resp 16 | Wt 165.5 lb

## 2015-12-13 DIAGNOSIS — K117 Disturbances of salivary secretion: Secondary | ICD-10-CM

## 2015-12-13 DIAGNOSIS — Z510 Encounter for antineoplastic radiation therapy: Secondary | ICD-10-CM | POA: Diagnosis not present

## 2015-12-13 DIAGNOSIS — R634 Abnormal weight loss: Secondary | ICD-10-CM | POA: Insufficient documentation

## 2015-12-13 DIAGNOSIS — R5383 Other fatigue: Secondary | ICD-10-CM | POA: Insufficient documentation

## 2015-12-13 DIAGNOSIS — R682 Dry mouth, unspecified: Secondary | ICD-10-CM

## 2015-12-13 DIAGNOSIS — Z923 Personal history of irradiation: Secondary | ICD-10-CM | POA: Insufficient documentation

## 2015-12-13 DIAGNOSIS — D329 Benign neoplasm of meninges, unspecified: Secondary | ICD-10-CM | POA: Insufficient documentation

## 2015-12-13 MED ORDER — SONAFINE EX EMUL
1.0000 "application " | Freq: Two times a day (BID) | CUTANEOUS | Status: DC
  Administered 2015-12-13: 1 via TOPICAL
  Filled 2015-12-13: qty 45

## 2015-12-13 MED ORDER — VITAMIN E 15 UNIT/0.3ML PO SOLN: 100.0000 [IU] | Freq: Every day | ORAL | Status: DC

## 2015-12-13 NOTE — Progress Notes (Signed)
  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  30/30 fractions completed. Vitals stable. Denies pain. Weight loss continues with 3lb loss since last week. Reports appetite remains poor related to taste changes. Reports dry mouth as well. He is using biotene. Reports dizziness this morning. Using sonafine bid as directed. Second tube given. Instructed to continue use bid for the next two weeks. Denies dry eye. Reports fatigue. He reports pruritus in the treatment area.  Set-up films were reviewed. The chart was checked.  Physical Findings  weight is 165 lb 8 oz (75.07 kg). His blood pressure is 109/76 and his pulse is 82. His respiration is 16 and oxygen saturation is 100%.    In no acute distress. Left proptosis unchanged. Pt does have dense hyperpigmentation and dry skin in the left forehead/temple/cheek area from radiation with no significant desquamation.   Impression The patient has tolerated radiation.  Plan The patient has completed radiation treatment. One month follow up appointment card given. Instructed to call the RN with future needs. Aqueous vitamin E was prescribed for mucositis.    Sheral Apley Tammi Klippel, M.D.  This document serves as a record of services personally performed by Tyler Pita, MD. It was created on his behalf by Darcus Austin, a trained medical scribe. The creation of this record is based on the scribe's personal observations and the provider's statements to them. This document has been checked and approved by the attending provider.

## 2015-12-13 NOTE — Progress Notes (Addendum)
Vitals stable. Denies pain. Weight loss continues. Reports fatigue. Alopecia noted. Reports appetite remains poor related to taste changes. Reports dry mouth as well. Hyperpigmentation of within treatment field noted without desquamation.  Reports dizziness this morning. Using sonafine bid as directed. Second tube given. Instructed to continue use bid for the next two weeks. Denies dry eye. Left eye blood shot. One month follow up appointment card given. Instructed to call this RN with future needs.   BP 109/76 mmHg  Pulse 82  Resp 16  Wt 165 lb 8 oz (75.07 kg)  SpO2 100% Wt Readings from Last 3 Encounters:  12/13/15 165 lb 8 oz (75.07 kg)  12/06/15 168 lb 1.6 oz (76.25 kg)  11/29/15 169 lb 4.8 oz (76.794 kg)

## 2015-12-19 ENCOUNTER — Telehealth: Payer: Self-pay | Admitting: Radiation Oncology

## 2015-12-19 NOTE — Telephone Encounter (Signed)
Patient left message requesting return call. Phoned patient back. Patient reports he has hard stool in his rectum he is unable to pass and have been unable to achieve "a good" bowel movement in days. Encouraged patient to use a fleets enema to evacuate stool in the rectum followed by magnesium citrate. Encouraged patient to remain hydrated during this process since magnesium citrate can be depleting. Patient verbalized understanding. Patient denies any further needs at this time.

## 2015-12-19 NOTE — Progress Notes (Signed)
  Radiation Oncology         [phone] ________________________________  Name: Leng Quispe MRN: [id number]  Date: 12/13/2015  DOB: 07-Nov-1960  End of Treatment Note  ICD-9-CM ICD-10-CM     1. 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   Localized hair loss and dermatitis developed.  Plan: The patient has completed radiation treatment. The patient will return to radiation oncology clinic for routine followup in one month. I advised him to call or return sooner if he has any questions or concerns related to his recovery or treatment. ________________________________  Sheral Apley. Tammi Klippel, M.D.

## 2016-01-14 ENCOUNTER — Ambulatory Visit
Admission: RE | Admit: 2016-01-14 | Discharge: 2016-01-14 | Disposition: A | Discharge status: Home / Self Care | Payer: 59 | Source: Ambulatory Visit | Attending: Radiation Oncology | Admitting: Radiation Oncology

## 2016-01-14 ENCOUNTER — Encounter: Payer: Self-pay | Admitting: Radiation Oncology

## 2016-01-14 VITALS — BP 110/75 | HR 78 | Resp 16 | Wt 164.0 lb

## 2016-01-14 DIAGNOSIS — D329 Benign neoplasm of meninges, unspecified: Secondary | ICD-10-CM

## 2016-01-14 NOTE — Progress Notes (Signed)
  Radiation Oncology         [phone] ________________________________  Name: John Glover MRN: [id number]  Date: 01/14/2016  DOB: [birth year]  Follow-Up Visit Note  CC: No primary care provider on file.  Zagar, Kris Hartmann, MD  Diagnosis:   John Glover is a 55 year-old John Glover with recurrent left sphenoid wing meningioma.    ICD-9-CM ICD-10-CM   1. 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  Narrative:  The patient returns today for routine follow-up.  Weight and vitals stable. Denies pain. Skin within treatment field has returned to normal color and texture. Reports using coconut oil sporadically. Alopecia continues but, seems to be slowly improving. Reports energy level is gradually improving. Reports he is still unable to eat bread because of the lack of saliva production. Dropping of left eye continues. Denies diplopia. Denies ringing in the ears. Denies nausea or vomiting. Denies dry eye or tearing.  ALLERGIES:  has No Known Allergies.  Meds: Current Outpatient Prescriptions  Medication Sig Dispense Refill  . metFORMIN (GLUCOPHAGE-XR) 500 MG 24 hr tablet     . Multiple Vitamin (MULTIVITAMIN) tablet Take 1 tablet by mouth daily.    . Vitamin D, Ergocalciferol, (DRISDOL) 50000 units CAPS capsule TAKE 1 CAPSULE (50,000 UNITS TOTAL) BY MOUTH ONCE A WEEK.    . vitamin e (AQUEOUS VITAMIN E) 15 UNIT/0.3ML SOLN solution Take 2 mLs (100 Units total) by mouth daily. 30 mL 0   No current facility-administered medications for this encounter.    Physical Findings: The patient is in no acute distress. Patient is alert and oriented.  weight is 164 lb (74.39 kg). His blood pressure is 110/75 and his pulse is 78. His respiration is 16 and oxygen saturation is 100%. Hyperpimentation much improved.  Epilation persists.    Lab Findings: No results found for: WBC, HGB, HCT, PLT  Lab Results  Component Value Date     BUN 15.6 10/16/2015   CREATININE 1.1 10/16/2015    Radiographic Findings: No results found.  Impression:  The patient is recovering from the effects of radiation.  Plan: He will have an MRI in 2 months and will follow up with me afterwards.  _____________________________________  Sheral Apley Tammi Klippel, M.D.    This document serves as a record of services personally performed by Tyler Pita, MD. It was created on his behalf by Lendon Collar, a trained medical scribe. The creation of this record is based on the scribe's personal observations and the provider's statements to them. This document has been checked and approved by the attending provider.

## 2016-01-14 NOTE — Progress Notes (Signed)
Weight and vitals stable. Denies pain. Skin within treatment field has returned to normal color and texture. Reports using coconut oil sporadically. Alopecia continues but, seems to be slowly improving. Reports energy level is gradually improving. Reports he is still unable to eat bread because of the lack of saliva production. Dropping of left eye continues. Denies diplopia. Denies ringing in the ears. Denies nausea or vomiting. Denies dry eye or tearing.    BP 110/75 mmHg  Pulse 78  Resp 16  Wt 164 lb (74.39 kg)  SpO2 100%   Wt Readings from Last 3 Encounters:  01/14/16 164 lb (74.39 kg)  12/13/15 165 lb 8 oz (75.07 kg)  12/06/15 168 lb 1.6 oz (76.25 kg)

## 2016-02-14 ENCOUNTER — Ambulatory Visit: Payer: Self-pay | Admitting: Radiation Oncology

## 2016-03-13 ENCOUNTER — Other Ambulatory Visit: Payer: Self-pay | Admitting: Radiation Therapy

## 2016-03-13 DIAGNOSIS — D329 Benign neoplasm of meninges, unspecified: Secondary | ICD-10-CM

## 2016-03-28 ENCOUNTER — Ambulatory Visit
Admission: RE | Admit: 2016-03-28 | Discharge: 2016-03-28 | Disposition: A | Discharge status: Home / Self Care | Payer: 59 | Source: Ambulatory Visit | Attending: Radiation Oncology | Admitting: Radiation Oncology

## 2016-03-28 DIAGNOSIS — D329 Benign neoplasm of meninges, unspecified: Secondary | ICD-10-CM

## 2016-03-28 MED ORDER — GADOBENATE DIMEGLUMINE 529 MG/ML IV SOLN
15.0000 mL | Freq: Once | INTRAVENOUS | Status: AC | PRN
  Administered 2016-03-28: 15 mL via INTRAVENOUS

## 2016-03-31 ENCOUNTER — Ambulatory Visit
Admission: RE | Admit: 2016-03-31 | Discharge: 2016-03-31 | Disposition: A | Discharge status: Home / Self Care | Payer: 59 | Source: Ambulatory Visit | Attending: Radiation Oncology | Admitting: Radiation Oncology

## 2016-03-31 ENCOUNTER — Encounter: Payer: Self-pay | Admitting: Radiation Oncology

## 2016-03-31 VITALS — BP 103/67 | HR 71 | Temp 98.7°F | Resp 16 | Wt 163.0 lb

## 2016-03-31 DIAGNOSIS — Z9889 Other specified postprocedural states: Secondary | ICD-10-CM | POA: Diagnosis not present

## 2016-03-31 DIAGNOSIS — Z79899 Other long term (current) drug therapy: Secondary | ICD-10-CM | POA: Insufficient documentation

## 2016-03-31 DIAGNOSIS — Z923 Personal history of irradiation: Secondary | ICD-10-CM | POA: Diagnosis not present

## 2016-03-31 DIAGNOSIS — Z7984 Long term (current) use of oral hypoglycemic drugs: Secondary | ICD-10-CM | POA: Diagnosis not present

## 2016-03-31 DIAGNOSIS — D329 Benign neoplasm of meninges, unspecified: Secondary | ICD-10-CM | POA: Insufficient documentation

## 2016-03-31 NOTE — Progress Notes (Signed)
Weight and vitals stable. Denies pain. Hair growth noted within treatment field. Hyperpigmentation within treatment field has resolved. Reports stamina is back to normal. Reports dry mouth and taste changes have resolved. Denies headache, dizziness, nausea, vomiting, diplopia or ringing in the ears. Reports tearing from the left eye continues but, is no worse.   BP 103/67 mmHg  Pulse 71  Temp(Src) 98.7 F (37.1 C) (Oral)  Resp 16  Wt 163 lb (73.936 kg)  SpO2 100%  Wt Readings from Last 3 Encounters:  03/31/16 163 lb (73.936 kg)  01/14/16 164 lb (74.39 kg)  12/13/15 165 lb 8 oz (75.07 kg)

## 2016-03-31 NOTE — Progress Notes (Signed)
Radiation Oncology         [phone] ________________________________  Name: John Glover MRN: [id number]  Date: 03/31/2016  DOB: [birth year]    Follow-Up Visit Note  CC: No primary care provider on file.  Glover, John Hartmann, MD  Diagnosis:   John Glover is a 55 year-old gentleman with recurrent left sphenoid wing meningioma.    ICD-9-CM ICD-10-CM   1. 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  11/01/2015-12/13/2015: 54 Gy in 30 fractions of 1.8 Gy to the sphenoid on the left.  Narrative:  The patient returns today for routine follow-up.  On review of systems,  the patient is pleased that the care of his eyebrows grown back since completing treatment. He states that his skin tone has normalized and he no longer sees hyperpigmentation within treatment field. His previously noted fatigue, dry mouth, and taste changes have resolved. He is not experiencing any headache, dizziness, nausea, vomiting, diplopia or ringing in the ears. He continues to experience some tearing from the left eye continues without change, and continues to notice ptosis of his left eye. He denies any numbness, or sensory changes of his cheeks, for her chin on the left side, difficulty chewing on the left side.He denies any chest pain or shortness of breath. A complete review of systems is obtained and is otherwise negative.   ALLERGIES:  has No Known Allergies.  Meds: Current Outpatient Prescriptions  Medication Sig Dispense Refill  . metFORMIN (GLUCOPHAGE-XR) 500 MG 24 hr tablet     . Multiple Vitamin (MULTIVITAMIN) tablet Take 1 tablet by mouth daily.    . Vitamin D, Ergocalciferol, (DRISDOL) 50000 units CAPS capsule TAKE 1 CAPSULE (50,000 UNITS TOTAL) BY MOUTH ONCE A WEEK.     No current facility-administered medications for this encounter.    Physical Findings:  weight is 163 lb (73.936 kg). His oral temperature is 98.7 F  (37.1 C). His blood pressure is 103/67 and his pulse is 71. His respiration is 16 and oxygen saturation is 100%.   Pain scale 0/10 In general this is a well appearing Hispanic male in no acute distress. He's alert and oriented x4 and appropriate throughout the examination. Cardiopulmonary assessment is negative for acute distress and he exhibits normal effort. HEENT reveals that he is normocephalic atraumatic, ptosis of the left eye is noted and is stable from previous. This craniotomy incision is well-healed and he has new hair growth along the treatment field. EOMs are intact bilaterally, PERRLA is noted. Sensation is tested with light touch over the patient's face and he does not have any sensory deficits.   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 Postop surgery and stereotactic radiosurgery. EXAM: MRI HEAD WITHOUT AND WITH CONTRAST TECHNIQUE: Multiplanar, multiecho pulse sequences of the brain and surrounding structures were obtained without and with intravenous contrast. CONTRAST:  23mL MULTIHANCE GADOBENATE DIMEGLUMINE 529 MG/ML IV SOLN COMPARISON:  MRI 10/28/2015 FINDINGS: Extensive infiltrating meningioma left sphenoid wing. Tumor fills the sphenoid sinus and unchanged in size measuring 27 x 23 mm. A small amount of tumor in the left sella which is unchanged. There is tumor involving the left cavernous sinus extending into the middle  cranial fossa on the left. This is unchanged. Tumor extends into the left superior orbit causing exophthalmos. There is tumor displacing the superior and lateral rectus muscles on the left similar in degree to the prior study. There is considerable enhancement involving the pterygoid muscles on the left and slight enhancement of  the left masseter muscle. This is likely due to denervation atrophy changes. 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 Ventricle size normal. No shift of the midline structures. Postop craniotomy on the left. Negative for acute infarct. No significant chronic ischemic changes. Small area of encephalomalacia left frontal lobe related to prior surgery. Chronic micro hemorrhage in the right cingulate gyrus above the corpus callosum and in the right posterior temporal lobe. IMPRESSION: Infiltrating meningioma involving the central skull base and cavernous sinus on the left is stable. There is extension into the posterior and superior orbit which is unchanged. Continued edema and enhancement of the muscles of mastication on left likely related to involvement of V3 at the level of foramina ovale. This could represent direct tumor extension however is more likely due to denervation atrophy. Prior MRI was done with trigeminal protocol which involves T1 fat suppression. This is useful and separating tumor from fat especially in the orbit. This should be considered for future follow-up. Electronically Signed   By: Franchot Gallo M.D.   On: 03/28/2016 11:30    Impression: 55 y.o. male with a recurrent meningioma s/p surgical resection and definitive radiotherapy  Plan:The patient appears to be doing well overall, Dr. Tammi Klippel reviews the MRI scans, we have reviewed the discrepancy in the original read to the discussion at brain conference. The findings are reassuring, and we will proceed with MRI of the face for his next assessment in 6 months time. Dr. Tammi Klippel discusses that if at that time his scan is January 2 stable, we would extend out his interval. Annual scans. Patient states agreement and understanding and is encouraged to call if he has any questions or concerns. He is given precautions to call prior to his next visit  The above documentation reflects my direct findings  during this shared patient visit. Please see the separate note by Dr. Tammi Klippel on this date for the remainder of the patient's plan of care.    Carola Rhine, PAC    This document serves as a record of services personally performed by Tyler Pita, MD. It was created on his behalf by Truddie Hidden, a trained medical scribe. The creation of this record is based on the scribe's personal observations and the provider's statements to them. This document has been checked and approved by the attending provider.

## 2016-04-03 NOTE — Addendum Note (Signed)
Encounter addended by: Heywood Footman, RN on: 04/03/2016  8:57 AM<BR>     Documentation filed: Charges VN

## 2016-08-20 ENCOUNTER — Other Ambulatory Visit: Payer: Self-pay | Admitting: *Deleted

## 2016-08-20 DIAGNOSIS — D329 Benign neoplasm of meninges, unspecified: Secondary | ICD-10-CM

## 2016-09-17 ENCOUNTER — Ambulatory Visit
Admission: RE | Admit: 2016-09-17 | Discharge: 2016-09-17 | Disposition: A | Discharge status: Home / Self Care | Payer: 59 | Source: Ambulatory Visit | Attending: Radiation Oncology | Admitting: Radiation Oncology

## 2016-09-17 DIAGNOSIS — D329 Benign neoplasm of meninges, unspecified: Secondary | ICD-10-CM

## 2016-09-17 MED ORDER — GADOBENATE DIMEGLUMINE 529 MG/ML IV SOLN
15.0000 mL | Freq: Once | INTRAVENOUS | Status: AC | PRN
  Administered 2016-09-17: 15 mL via INTRAVENOUS

## 2016-09-17 NOTE — Progress Notes (Signed)
Mr. John Glover here for a six month follow up appointment for recurrent left sphenoid wing meningioma, review MRI of brain wwo contrast from 09-17-16.  Pain:None Headache:None today, when he does have a headache it is on the left side of his head.None Nausea/ vomiting:None Dizziness:None Diplopia:None Ringing in the ears:None Skin change:Normal color Wt Readings from Last 3 Encounters:  09/22/16 175 lb (79.4 kg)  03/31/16 163 lb (73.9 kg)  01/14/16 164 lb (74.4 kg)  BP 120/83   Pulse 87   Temp 98 F (36.7 C) (Oral)   Resp 18   Ht 5\' 7"  (1.702 m)   Wt 175 lb (79.4 kg)   SpO2 100%   BMI 27.41 kg/m

## 2016-09-22 ENCOUNTER — Ambulatory Visit
Admission: RE | Admit: 2016-09-22 | Discharge: 2016-09-22 | Disposition: A | Discharge status: Home / Self Care | Payer: 59 | Source: Ambulatory Visit | Attending: Radiation Oncology | Admitting: Radiation Oncology

## 2016-09-22 ENCOUNTER — Encounter: Payer: Self-pay | Admitting: Radiation Oncology

## 2016-09-22 VITALS — BP 120/83 | HR 87 | Temp 98.0°F | Resp 18 | Ht 67.0 in | Wt 175.0 lb

## 2016-09-22 DIAGNOSIS — Z79899 Other long term (current) drug therapy: Secondary | ICD-10-CM | POA: Diagnosis not present

## 2016-09-22 DIAGNOSIS — Z7984 Long term (current) use of oral hypoglycemic drugs: Secondary | ICD-10-CM | POA: Insufficient documentation

## 2016-09-22 DIAGNOSIS — D329 Benign neoplasm of meninges, unspecified: Secondary | ICD-10-CM | POA: Insufficient documentation

## 2016-09-22 DIAGNOSIS — R51 Headache: Secondary | ICD-10-CM | POA: Diagnosis not present

## 2016-09-22 DIAGNOSIS — R600 Localized edema: Secondary | ICD-10-CM | POA: Diagnosis not present

## 2016-09-22 NOTE — Progress Notes (Addendum)
Radiation Oncology         [phone] ________________________________  Name: John Glover MRN: [id number]  Date: 09/22/2016  DOB: 1961-04-04    Follow-Up Visit Note  CC: No primary care provider on file.  No ref. provider found  Diagnosis:   John Glover is a 55 year-old gentleman with recurrent left sphenoid wing meningioma.    ICD-9-CM ICD-10-CM   1. 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  11/01/2015-12/13/2015: 54 Gy in 30 fractions of 1.8 Gy to the sphenoid on the left.  Narrative:  The patient returns today for routine follow up but to summarize his case he was treated surgically followed by radiotherapy to the sphenoid region for a meningioma which was involving the left orbital connective tissue. He comes today for follow up MRI review. This was performed on 09/17/16 and did not reveal concerns of progression of his treated meningioma or of any new sites of meningioma.  On review of systems, the patient reports he's doing great. He denies any progressive headaches, but still does have some left sided headaches. 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 He reports that his headaches seem to be more noticeable in the evening. He denies any chest pain, shortness of breath, fevers, chills, nausea, vomiting, abdominal pain, bowel or bladder dysfunction. He denies any new musculoskeletal aches or pains. He describes tearing of his left eye and has noticed increasing irritation of this eye in the past few months. He denies any visual disturbances or auditory disturbances. A complete review of systems is obtained and is otherwise negative.  ALLERGIES:  has No Known Allergies.  Meds: Current Outpatient Prescriptions  Medication Sig Dispense Refill  . atorvastatin (LIPITOR) 10 MG tablet Take 10 mg by mouth daily.    . metFORMIN (GLUCOPHAGE-XR) 500 MG 24 hr  tablet     . Multiple Vitamin (MULTIVITAMIN) tablet Take 1 tablet by mouth daily.    . Vitamin D, Ergocalciferol, (DRISDOL) 50000 units CAPS capsule TAKE 1 CAPSULE (50,000 UNITS TOTAL) BY MOUTH ONCE A WEEK.     No current facility-administered medications for this encounter.     Physical Findings:  height is 5\' 7"  (1.702 m) and weight is 175 lb (79.4 kg). His oral temperature is 98 F (36.7 C). His blood pressure is 120/83 and his pulse is 87. His respiration is 18 and oxygen saturation is 100%.   Pain scale 0/10 In general this is a well appearing Hispanic male in no acute distress. He's alert and oriented x4 and appropriate throughout the examination. Cardiopulmonary assessment is negative for acute distress and he exhibits normal effort. HEENT reveals that he is normocephalic atraumatic, ptosis of the left eye is noted and is stable from previous. There is scleral injection without hemorrhage. The eye is somewhat teary but no purulent matter is noted.This craniotomy incision is well-healed and he has new hair growth along the treatment field. EOMs are intact bilaterally, PERRLA is noted.    Lab Findings: No results found for: WBC, HGB, HCT, PLT  Lab Results  Component Value Date   BUN 15.6 10/16/2015   CREATININE 1.1 10/16/2015    Radiographic Findings: John Glover [id number] Contrast  Result Date: 09/17/2016 CLINICAL DATA:  Benign neoplasm meninges. Craniotomy and stereotactic radiosurgery. Creatinine was obtained on site at [facility] at [address] Results: Creatinine 1.0  mg/dL. EXAM: MRI HEAD WITHOUT AND WITH CONTRAST TECHNIQUE: Multiplanar, multiecho pulse sequences of the brain and surrounding structures were obtained without and with intravenous contrast. CONTRAST:  67mL MULTIHANCE GADOBENATE DIMEGLUMINE 529 MG/ML IV SOLN COMPARISON:  MRI 03/28/2016 FINDINGS: Brain: Left-sided craniotomy for sphenoid wing meningioma resection. There is residual enhancing tumor along the  sphenoid wing which is unchanged. There is enhancing tumor within the sphenoid sinus which is unchanged. Tumor infiltrates the left cavernous sinus and the left sella, unchanged. There is tumor invading the orbital apex on the left. There is also tumor in the left superior orbit and orbital roof unchanged. There is significant exophthalmos which appears unchanged. Abnormal enhancement of the pterygoid muscles just below the temporal bone is unchanged and may represent tumor infiltration. There is tumor in the pterygopalatine fossa on the left. Ventricle size normal. No significant shift of the midline structures. Chronic encephalomalacia left frontal lobe unchanged. Chronic micro hemorrhage in the right posterior temporal lobe and right medial frontal lobe unchanged. Negative for acute infarct. Vascular: Normal arterial flow voids including the left cavernous carotid which shows partial tumor encasement. Skull and upper cervical spine: Bifrontal craniotomy. Sinuses/Orbits: Tumor in the sphenoid sinus is stable. Prior medial antrostomy bilaterally. Mucosal edema in the paranasal sinuses bilaterally mild. Left orbit tumor described above. Other: None IMPRESSION: Left sphenoid wing meningioma is stable from the prior study. There is tumor in the sphenoid sinus, orbital apex in orbital roof and superior orbit on the left causing exophthalmos. There is tumor along the sphenoid wing and cavernous sinus. Electronically Signed   By: Franchot Gallo M.D.   On: 09/17/2016 15:16    Impression/Plan: 1.  55 y.o. male with a recurrent meningioma s/p surgical resection and definitive radiotherapy.The patient appears to be doing well and is without evidence of progressive changes. We will plan to follow up with him in 6 months with repeat imaging, and then consider extending out his interval for imaging.  2. Tearing of the left eye. His eye has scleral injection and I have encouraged him to proceed to opthalmology in January  2018 which he has scheduled. In the meantime he is going to try OTC antihistamines to see if this makes any difference in his symptoms.   The above documentation reflects my direct findings during this shared patient visit. Please see the separate note by Dr. Tammi Klippel on this date for the remainder of the patient's plan of care.    Carola Rhine, PAC

## 2016-10-20 ENCOUNTER — Ambulatory Visit: Payer: Self-pay | Admitting: Radiation Oncology

## 2017-03-26 ENCOUNTER — Other Ambulatory Visit: Payer: Self-pay | Admitting: *Deleted

## 2017-03-26 DIAGNOSIS — D329 Benign neoplasm of meninges, unspecified: Secondary | ICD-10-CM

## 2017-04-06 IMAGING — MR MR HEAD WO/W CM
11 series · 48 of 48 positions shown · IV contrast (Multihance 15ml)
Comparison: MRI 03/28/2016

CLINICAL DATA: Benign neoplasm meninges. Craniotomy and
stereotactic radiosurgery.

Creatinine was obtained on site at [HOSPITAL] at [HOSPITAL].
Results: Creatinine 1.0 mg/dL.
EXAM:
MRI HEAD WITHOUT AND WITH CONTRAST
TECHNIQUE: Multiplanar, multiecho pulse sequences of the brain and surrounding
structures were obtained without and with intravenous contrast.
CONTRAST:  15mL MULTIHANCE GADOBENATE DIMEGLUMINE 529 MG/ML IV SOLN

[Series 2: FLAIR · sagittal · 3.0mm · 0.75mm/px · 2 of 39 slices shown (1 of 2)]
[im 1/39]
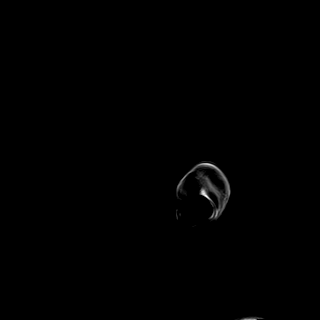
[im 39/39]
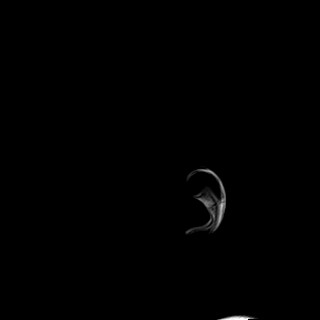

[Series 3: DWI · axial · 3.0mm · 1.50mm/px · z∈[-107,+57]mm · 5 of 86 slices shown (1 of 2)]
[im 1/86]
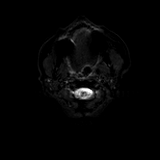
[im 22/86]
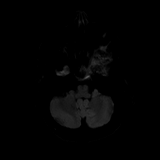
[im 43/86]
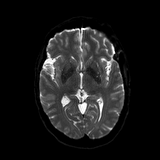
[im 64/86]
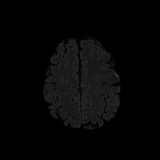
[im 86/86]
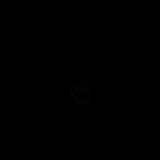

[Series 4: DWI · axial · 3.0mm · 1.50mm/px · z∈[-107,+57]mm · 3 of 42 slices shown (2 of 2)]
[im 1/42]
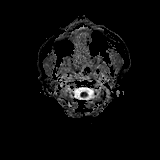
[im 21/42]
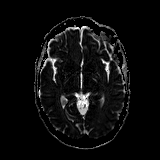
[im 42/42]
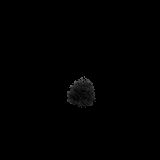

[Series 5: T2 · axial · 5.0mm · 0.57mm/px · z∈[-102,+60]mm · 2 of 28 slices shown]
[im 1/28]
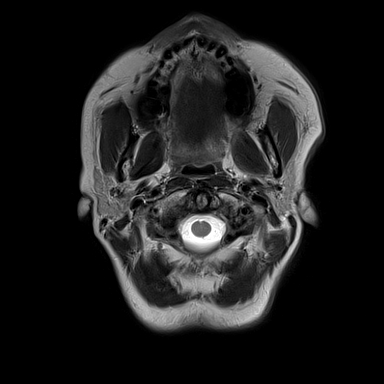
[im 28/28]
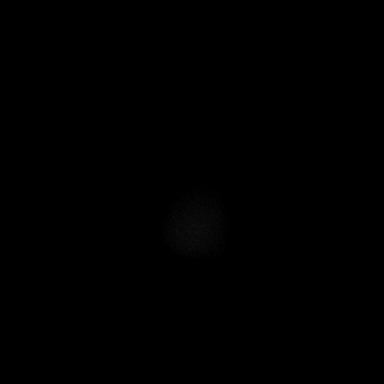

[Series 6: GRE · axial · 5.0mm · 0.57mm/px · z∈[-102,+60]mm · 2 of 28 slices shown]
[im 1/28]
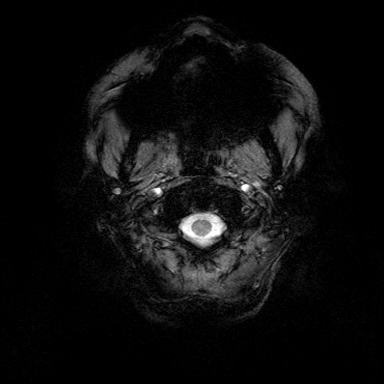
[im 28/28]
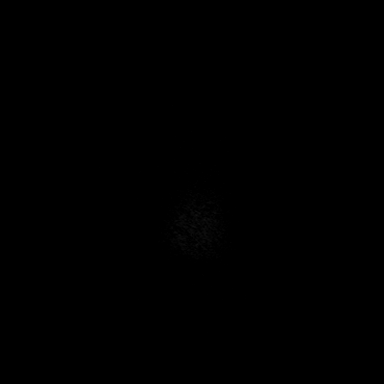

[Series 7: FLAIR · axial · 3.0mm · 0.57mm/px · z∈[-106,+59]mm · 4 of 56 slices shown (2 of 2)]
[im 1/56]
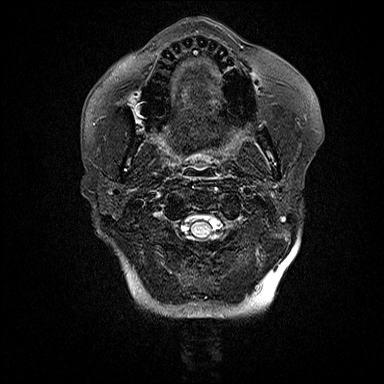
[im 19/56]
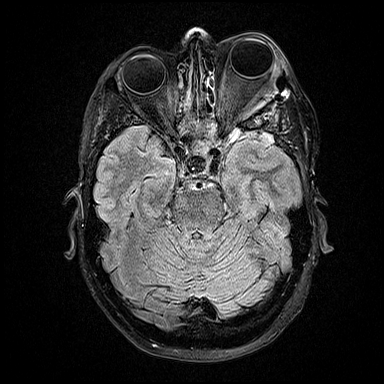
[im 37/56]
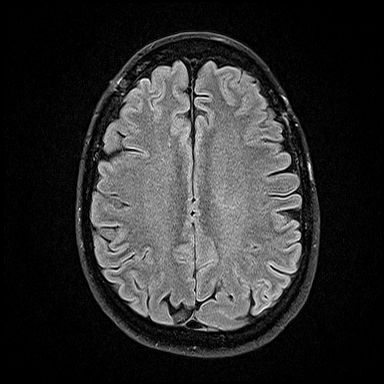
[im 56/56]
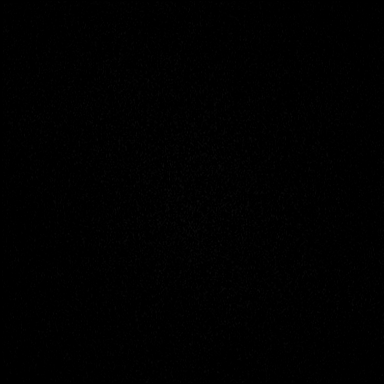

[Series 8: T1 · axial · 1.0mm · 0.75mm/px · z∈[-112,+58]mm · 11 of 171 slices shown]
[im 1/171]
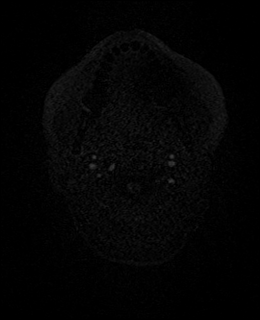
[im 18/171]
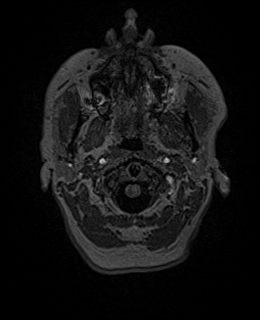
[im 35/171]
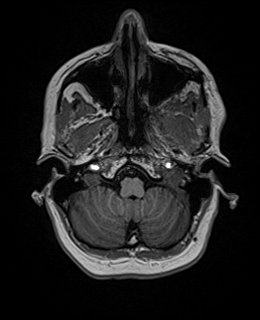
[im 52/171]
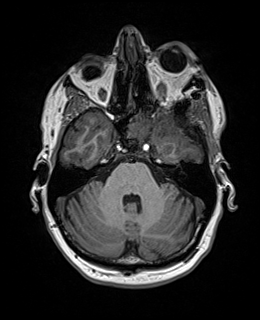
[im 69/171]
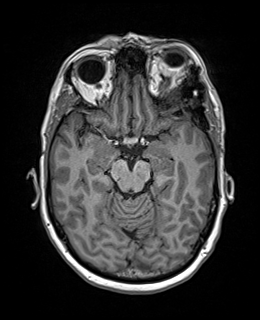
[im 86/171]
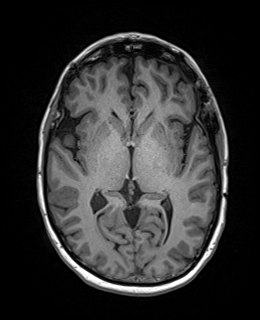
[im 103/171]
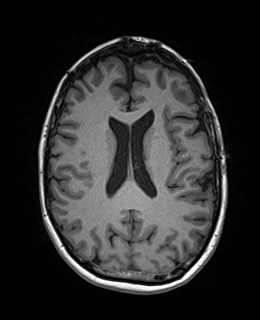
[im 120/171]
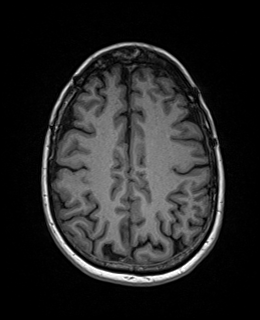
[im 137/171]
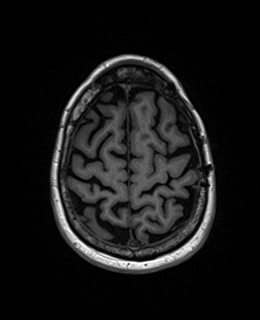
[im 154/171]
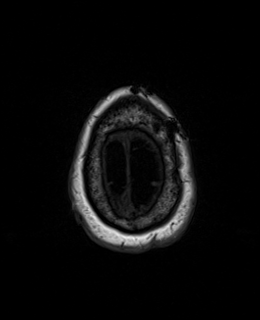
[im 171/171]
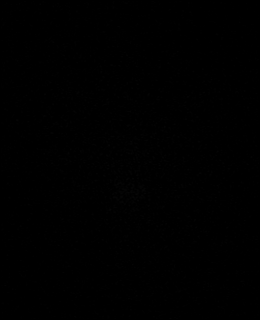

[Series 9: T2 post-contrast · coronal · 3.0mm · 0.57mm/px · 3 of 50 slices shown]
[im 1/50]
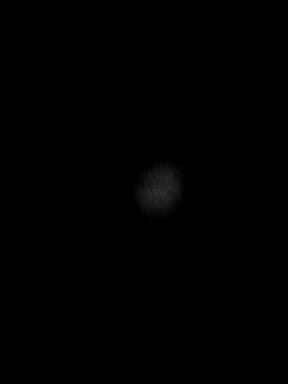
[im 25/50]
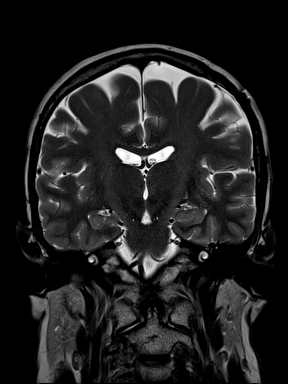
[im 50/50]
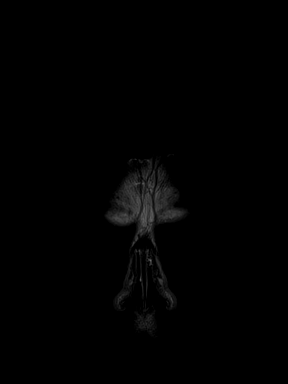

[Series 10: T1 post-contrast · axial · 1.0mm · 0.75mm/px · z∈[-112,+60]mm · 11 of 173 slices shown (1 of 2)]
[im 1/173]
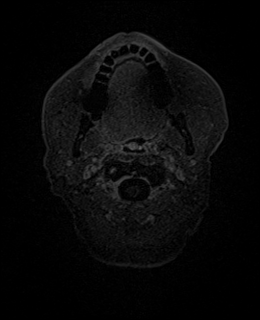
[im 18/173]
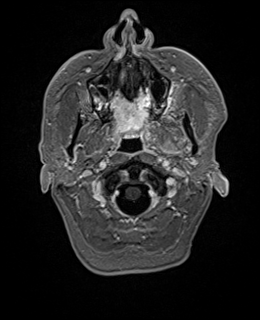
[im 35/173]
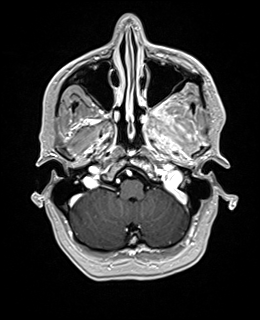
[im 52/173]
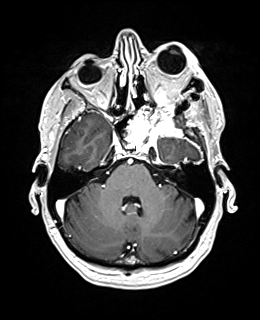
[im 69/173]
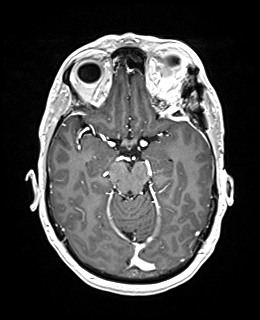
[im 87/173]
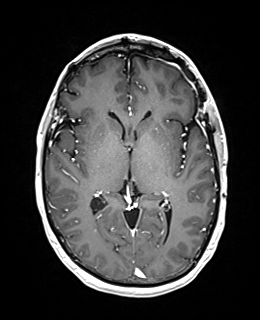
[im 104/173]
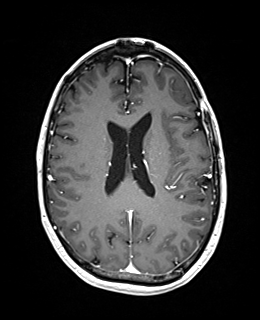
[im 121/173]
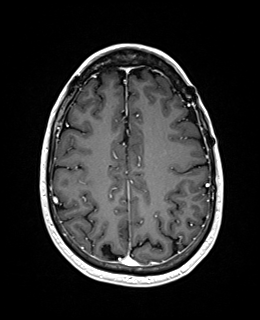
[im 138/173]
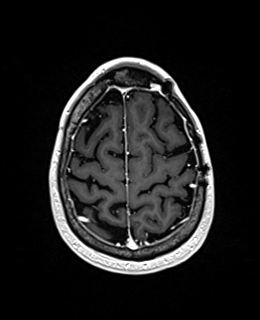
[im 155/173]
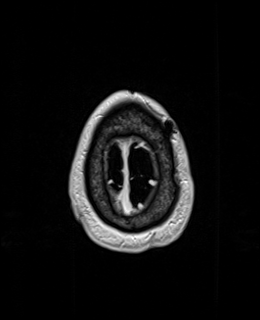
[im 173/173]
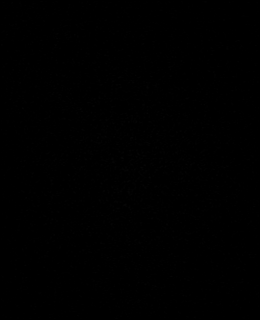

[Series 11: T1 post-contrast · coronal · 3.0mm · 0.57mm/px · 3 of 50 slices shown (2 of 2)]
[im 1/50]
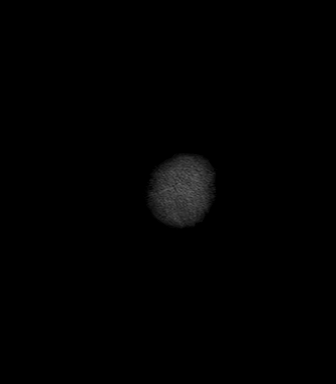
[im 25/50]
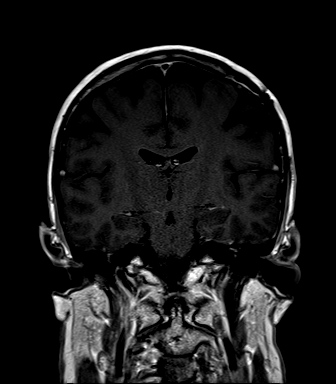
[im 50/50]
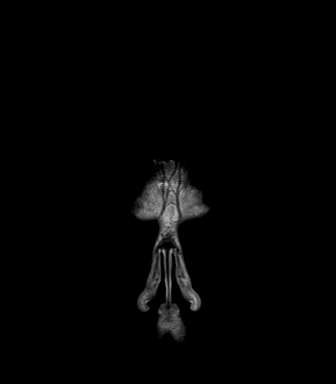

[Series 12: FLAIR post-contrast · sagittal · 3.0mm · 0.75mm/px · 2 of 39 slices shown]
[im 1/39]
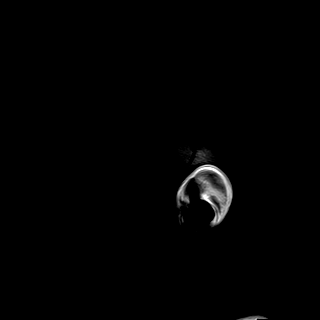
[im 39/39]
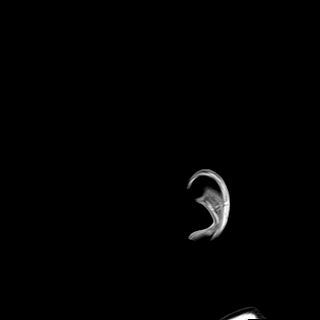

[48 of 48 positions shown; findings below may reference images not displayed]

FINDINGS: Brain: Left-sided craniotomy for sphenoid wing meningioma resection.
There is residual enhancing tumor along the sphenoid wing which is
unchanged. There is enhancing tumor within the sphenoid sinus which
is unchanged. Tumor infiltrates the left cavernous sinus and the
left sella, unchanged. There is tumor invading the orbital apex on
the left. There is also tumor in the left superior orbit and orbital
roof unchanged. There is significant exophthalmos which appears
unchanged. Abnormal enhancement of the pterygoid muscles just below
the temporal bone is unchanged and may represent tumor infiltration.
There is tumor in the pterygopalatine fossa on the left.

Ventricle size normal. No significant shift of the midline
structures. Chronic encephalomalacia left frontal lobe unchanged.
Chronic micro hemorrhage in the right posterior temporal lobe and
right medial frontal lobe unchanged. Negative for acute infarct.

Vascular: Normal arterial flow voids including the left cavernous
carotid which shows partial tumor encasement.

Skull and upper cervical spine: Bifrontal craniotomy.

Sinuses/Orbits: Tumor in the sphenoid sinus is stable. Prior medial
antrostomy bilaterally. Mucosal edema in the paranasal sinuses
bilaterally mild. Left orbit tumor described above.

Other: None
IMPRESSION: Left sphenoid wing meningioma is stable from the prior study. There
is tumor in the sphenoid sinus, orbital apex in orbital roof and
superior orbit on the left causing exophthalmos. There is tumor
along the sphenoid wing and cavernous sinus.

## 2017-04-20 NOTE — Progress Notes (Addendum)
John Glover 56 y.o man with Left sphenoid wing meningioma involving cavernous sinus radiation completed 12-13-15, review 04-23-17 MRI brain w contrast, FU.  Pain:Denies having pain. Headache:Denies having headaches. Nausea/ vomiting:None Dizziness:None Diplopia:None, has dry left eye irritation in the morning using eye dry for dry eyes. Ringing in the ears:None Skin change:None, hair growing back on the left side of his head.  Started Prednisone dose pak for poison ivy for 11 days, no evidence of thrush. Cognitive changes:Alert and oriented x 3 with fluent speech,able to complete sentences without difficulty with word finding or organization of sentences. Wt Readings from Last 3 Encounters:  04/27/17 172 lb 12.8 oz (78.4 kg)  09/22/16 175 lb (79.4 kg)  03/31/16 163 lb (73.9 kg)  BP 114/83   Pulse 70   Temp 98 F (36.7 C) (Oral)   Resp 18   Ht 5\' 7"  (1.702 m)   Wt 172 lb 12.8 oz (78.4 kg)   SpO2 100%   BMI 27.06 kg/m

## 2017-04-23 ENCOUNTER — Ambulatory Visit
Admission: RE | Admit: 2017-04-23 | Discharge: 2017-04-23 | Disposition: A | Discharge status: Home / Self Care | Payer: 59 | Source: Ambulatory Visit | Attending: Radiation Oncology | Admitting: Radiation Oncology

## 2017-04-23 DIAGNOSIS — D329 Benign neoplasm of meninges, unspecified: Secondary | ICD-10-CM

## 2017-04-23 MED ORDER — GADOBENATE DIMEGLUMINE 529 MG/ML IV SOLN
15.0000 mL | Freq: Once | INTRAVENOUS | Status: AC | PRN
  Administered 2017-04-23: 15 mL via INTRAVENOUS

## 2017-04-27 ENCOUNTER — Ambulatory Visit
Admission: RE | Admit: 2017-04-27 | Discharge: 2017-04-27 | Disposition: A | Discharge status: Home / Self Care | Payer: 59 | Source: Ambulatory Visit | Attending: Radiation Oncology | Admitting: Radiation Oncology

## 2017-04-27 ENCOUNTER — Encounter: Payer: Self-pay | Admitting: Radiation Oncology

## 2017-04-27 VITALS — BP 114/83 | HR 70 | Temp 98.0°F | Resp 18 | Ht 67.0 in | Wt 172.8 lb

## 2017-04-27 DIAGNOSIS — Z7984 Long term (current) use of oral hypoglycemic drugs: Secondary | ICD-10-CM | POA: Diagnosis not present

## 2017-04-27 DIAGNOSIS — Z833 Family history of diabetes mellitus: Secondary | ICD-10-CM | POA: Diagnosis not present

## 2017-04-27 DIAGNOSIS — D320 Benign neoplasm of cerebral meninges: Secondary | ICD-10-CM | POA: Diagnosis not present

## 2017-04-27 DIAGNOSIS — Z856 Personal history of leukemia: Secondary | ICD-10-CM | POA: Diagnosis not present

## 2017-04-27 DIAGNOSIS — Z79899 Other long term (current) drug therapy: Secondary | ICD-10-CM | POA: Diagnosis not present

## 2017-04-27 DIAGNOSIS — H0520 Unspecified exophthalmos: Secondary | ICD-10-CM | POA: Diagnosis not present

## 2017-04-27 DIAGNOSIS — D329 Benign neoplasm of meninges, unspecified: Secondary | ICD-10-CM

## 2017-04-27 DIAGNOSIS — Z923 Personal history of irradiation: Secondary | ICD-10-CM | POA: Insufficient documentation

## 2017-04-27 NOTE — Progress Notes (Signed)
Radiation Oncology         [phone] ________________________________  Name: John Glover MRN: [id number]  Date: 04/27/2017  DOB: March 17, 1961    Follow-Up Visit Note  CC: Patient, No Pcp Per  Zagar, Kris Hartmann, MD  Diagnosis:   56 y.o. gentleman with recurrent left sphenoid wing meningioma.  Interval Since Last Radiation:  16 months  11/01/2015-12/13/2015: 54 Gy in 30 fractions of 1.8 Gy to the sphenoid on the left.  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   Narrative:  The patient returns today for routine follow up but to summarize his case he was treated surgically in 1995 in [place] for a grade 1 meningioma about the left sphenoid wing. He was found to have slow progression of tumor identified between 2009 and 2016, and subsequently underwent radiotherapy to the sphenoid region for a meningioma which was involving the left orbital connective tissue. He has been without evidence of recurrence, and comes today for follow up MRI review. This was performed on 04/23/17, and did not reveal concerns of progression of his treated meningioma or of any new sites of meningioma. His case is complex however given the plaque like distribution of the lesion and the sites affected. He presented with Proptosis and exophthalmos. Despite this, his eye sight has been preserved. He follows with an optometrist for his eye glass prescription.   On review of systems, the patient reports that he is doing well overall. He reports he continues from time to time noting chronic left dry eye. He's tried OTC drops and antihistamines without significant relief. He reports that he does not have any facial pain or discomfort. He denies any visual acuity changes, or changes in terms of blurry vision, floaters in his visual field, or field cuts. He denies any chest pain, shortness of breath, cough, fevers, chills, night sweats, unintended weight changes. He denies any bowel or bladder  disturbances, and denies abdominal pain, nausea or vomiting. He denies any new musculoskeletal or joint aches or pains, new skin lesions or concerns. A complete review of systems is obtained and is otherwise negative.  Past Medical History:  Past Medical History:  Diagnosis Date  . H/O leukemia   . Meningioma [facility])     Past Surgical History: Past Surgical History:  Procedure Laterality Date  . CRANIOTOMY      Social History:  Social History   Social History  . Marital status: Married    Spouse name: N/A  . Number of children: N/A  . Years of education: N/A   Occupational History  . Not on file.   Social History Main Topics  . Smoking status: Never Smoker  . Smokeless tobacco: Never Used  . Alcohol use No  . Drug use: No  . Sexual activity: Yes   Other Topics Concern  . Not on file   Social History Narrative  . No narrative on file  The patient lives in [place]. 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  Family History: Family History  Problem Relation Age of Onset  . Diabetes Mother     ALLERGIES:  has No Known Allergies.  Meds: Current Outpatient Prescriptions  Medication Sig Dispense Refill  . atorvastatin (LIPITOR) 10 MG tablet Take 10 mg by mouth daily.    . metFORMIN (GLUCOPHAGE-XR) 500 MG 24 hr tablet     . Multiple Vitamin (MULTIVITAMIN) tablet Take 1 tablet by mouth daily.    . predniSONE (STERAPRED UNI-PAK 48 TAB)  10 MG (48) TBPK tablet Take by mouth daily. Following instructions on the dose pak for 11 days    . Vitamin D, Ergocalciferol, (DRISDOL) 50000 units CAPS capsule TAKE 1 CAPSULE (50,000 UNITS TOTAL) BY MOUTH ONCE A WEEK.     No current facility-administered medications for this encounter.     Physical Findings:  height is 5\' 7"  (1.702 m) and weight is 172 lb 12.8 oz (78.4 kg). His oral temperature is 98 F (36.7 C). His blood pressure is 114/83 and his pulse is 70. His respiration is 18 and oxygen saturation is 100%.   Pain scale 0/10 In  general this is a well appearing Hispanic male in no acute distress. He's alert and oriented x4 and appropriate throughout the examination. Cardiopulmonary assessment is negative for acute distress and he exhibits normal effort. HEENT reveals that he is normocephalic atraumatic, proptosis of the left eye is noted and is stable from previous. There is less scleral injection than previous, and again without hemorrhage. This craniotomy incision is well-healed. EOMs are intact bilaterally, PERRLA is noted.    Lab Findings: No results found for: WBC, HGB, HCT, PLT  Lab Results  Component Value Date   BUN 15.6 10/16/2015   CREATININE 1.1 10/16/2015    Radiographic Findings: [name] HY Contrast  Result Date: 04/23/2017 CLINICAL DATA:  Continued surveillance LEFT sphenoid wing meningioma. Status post surgery. Status post radiotherapy 11/01/2015-12/13/2015: 54 Gy in 30 fractions of 1.8 Gy to the sphenoid on the left. EXAM: MRI HEAD WITHOUT AND WITH CONTRAST TECHNIQUE: Multiplanar, multiecho pulse sequences of the brain and surrounding structures were obtained without and with intravenous contrast. CONTRAST:  46mL MULTIHANCE GADOBENATE DIMEGLUMINE 529 MG/ML IV SOLN Creatinine was obtained on site at [facility] at [address] Results: Creatinine 1.0 mg/dL. COMPARISON:  Additional description of the tumor under the MR trigeminal report. FINDINGS: Brain: No acute stroke, acute hemorrhage, intra-axial mass lesion, hydrocephalus, or extra-axial fluid. Normal for age cerebral volume. No significant white matter disease. Chronic areas of bifrontal gliosis, stable. LEFT sphenoid wing meningioma, projects into the LEFT middle cranial fossa, mild mass effect on the LEFT temporal lobe but no vasogenic edema. Post infusion, no enhancement of the brain. Regional extension into the LEFT orbit, LEFT cavernous sinus, LEFT sella, and infratemporal fossa, described separately. Vascular: Normal flow voids. Skull  and upper cervical spine: Normal marrow signal. Sequelae of prior bifrontal craniotomy. Sinuses/Orbits: No layering sinus fluid. LEFT orbit is abnormal and reported separately under MR trigeminal. Other: Compared with 09/17/2016, interval stability of the intracranial contents. IMPRESSION: Interval stability of LEFT sphenoid wing meningioma. Please see additional findings on MR trigeminal, reported separately. Electronically Signed   By: Staci Righter M.D.   On: 04/23/2017 15:22   Mr Face/trigeminal Wo/w Cm  Result Date: 04/23/2017 CLINICAL DATA:  Continued surveillance LEFT sphenoid wing meningioma. Status post surgery. Status post radiotherapy 11/01/2015-12/13/2015: 54 Gy in 30 fractions of 1.8 Gy to the sphenoid on the left. EXAM: MRI FACE TRIGEMINAL WITHOUT AND WITH CONTRAST TECHNIQUE: Multiplanar, multiecho pulse sequences of the face and surrounding structures, including thin slice imaging of the course of the Trigeminal Nerves, were obtained both before and after administration of intravenous contrast. CONTRAST:  56mL MULTIHANCE GADOBENATE DIMEGLUMINE 529 MG/ML IV SOLN Creatinine was obtained on site at [facility] at [address] Results: Creatinine 1.0 mg/dL. COMPARISON:  MR brain reported separately. Multiple prior examinations including MR head 09/17/2016. MR head 03/28/2016. MR using trigeminal protocol 10/28/2015,  and MR brain 10/10/2015. The primary comparison will be the trigeminal study from 10/28/2015, because that study used fat saturation and thin section smaller field-of-view imaging FINDINGS: There are multiple components of the locally invasive sphenoid wing meningioma: An intrasphenoid component is observed, minimally decreased in size from 2017, today measures 22 x 27 mm as compared with 23 x 28 mm previously. There is epidural intraosseous tumor, with a dural tail, LEFT sphenoid wing with intracranial extension. The bulky central component of this tumor measures 7 mm  anterior-posterior and 15 mm RIGHT-to-LEFT, which is unchanged from January 2017. The overall extent of the dural thickening and enhancement appears improved along the lateral aspect of the LEFT temporal lobe, see for instance image 17 series 20. Tumor extends into the LEFT cavernous sinus. the thickness of the LEFT cavernous sinus, measured at the level of the pituitary stalk insertion into the pituitary gland, is 5 mm, unchanged from priors. The lateral wall of cavernous sinus remains concave medially, and does not bulge outward. There is no apparent luminal narrowing of the LEFT ICA. There is LEFT intraorbital extension. There is obvious LEFT proptosis, which was noted previously. The degree of proptosis is unchanged based on similar measurements to 2017. Intraosseous tumor along the lateral orbital wall, which is adjacent to the lateral rectus and superior rectus, appears unchanged. At the orbital apex, the superior rectus and lateral rectus are immediately adjacent to subperiosteal tumor, and the LEFT lateral rectus appears mildly enlarged near its insertion, but there is no clear progression of disease from priors no intraconal extension. Tumor does fill the superior and inferior orbital fissures, and is immediately adjacent to the optic canal on the LEFT. Compare image 28 series 19 with image 22 series 11 previous. Very slight mass effect on the under surface of the LEFT frontal lobe has not progressed. 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 Relatively minor degree of intra sellar tumor on the LEFT is stable. Tumor extends through the foramen ovale, as noted previously, into the infratemporal fossa/masticator space. Tumor extends along the lateral wall maxillary sinus, obliterating the retro antral fat. Medial and lateral pterygoids are edematous, but not fatty replaced. BILATERAL maxillary antrostomies without layering fluid. Some mucosal thickening is seen in the maxillary,  frontal, and ethmoid sinuses. IMPRESSION: Overall findings consistent with interval stability of the treated LEFT sphenoid wing meningioma. No clear progression of tumor in the intracranial compartment, orbit, sella, infratemporal fossa, or paranasal sinuses. See discussion above. Electronically Signed   By: Staci Righter M.D.   On: 04/23/2017 15:44    Impression/Plan: 1.  56 y.o. male with a recurrent meningioma s/p surgical resection and definitive radiotherapy. We reviewed the patient's imaging studies and have given him a copy of his MRI. He appears to be doing well overall based on the MRI as well as clinically, and is without evidence of progressive changes. We will plan to follow up with him in 6 months with repeat imaging. Our discussion this morning however in conference resulted in continued recommendations to keep is imaging intervals at 6 months due to the complexity of his anatomy as a result of his previous tumor. He is also offered a referral to the neuro oncologist who's recently joined our team, Dr. Mickeal Skinner so that he can be followed from a neurologic specialty given again the distribution of disease, and need for formal neuro checks to watch for recurrence. A referral is placed.  2. Tearing of the left eye. The  patient was under the impression that he was seeing an opthalmologist, however it does not appear to be the case. I've suggested a referral to opthalmology given his previous history of radiotherapy treatment to evaluate the retina, as well as to make recommendations on tearing. Given the distribution of his tumor, I believe he should have more frequent eye care an exams.     Carola Rhine, PAC

## 2017-04-28 ENCOUNTER — Other Ambulatory Visit: Payer: Self-pay | Admitting: Radiation Oncology

## 2017-04-28 DIAGNOSIS — H0520 Unspecified exophthalmos: Secondary | ICD-10-CM

## 2017-04-28 DIAGNOSIS — D329 Benign neoplasm of meninges, unspecified: Secondary | ICD-10-CM

## 2017-04-28 NOTE — Addendum Note (Signed)
Encounter addended by: Hayden Pedro, PA-C on: 04/28/2017  7:02 AM<BR>    Actions taken: LOS modified, Follow-up modified

## 2017-04-30 ENCOUNTER — Telehealth: Payer: Self-pay | Admitting: *Deleted

## 2017-04-30 NOTE — Telephone Encounter (Signed)
CALLED PATIENT TO INFORM OF EYE DR. APPT. WITH DR. HAINES ON 05-11-17- ARRIVAL TIME - 8:30 AM , ADDRESS- [address], [address], PH. NO. [phone], LVM FOR A RETURN CALL

## 2017-05-22 ENCOUNTER — Encounter: Payer: Self-pay | Admitting: Hematology

## 2017-05-22 ENCOUNTER — Telehealth: Payer: Self-pay | Admitting: Hematology

## 2017-05-22 NOTE — Telephone Encounter (Signed)
Appt has been scheduled for the pt to see Dr. Irene Limbo on 8/22 at 11am. Pt arrive 15 minutes early. Letter mailed to the pt.

## 2017-06-03 ENCOUNTER — Ambulatory Visit: Payer: 59 | Admitting: Hematology

## 2017-06-17 ENCOUNTER — Ambulatory Visit: Payer: 59 | Admitting: Hematology

## 2017-07-01 NOTE — Progress Notes (Signed)
HEMATOLOGY/ONCOLOGY CONSULTATION NOTE  Date of Service: 07/02/2017  Patient Care Team: Patient, No Pcp Per as PCP - General (General Practice)  CHIEF COMPLAINTS/PURPOSE OF CONSULTATION:  left sphenoid wing meningioma  HISTORY OF PRESENTING ILLNESS:   John Glover is a wonderful 56 y.o. male who has been referred to [place] by Dr Ledon Snare MD  for evaluation and management of recurrent left sphenoid wing meningioma.   Patient was diagnosed with ALL at age 65 and received treatment at [facility] in [place]. He did not need a bone marrow transplant at that time. 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   In 1995, while he was living in [place] he underwent surgical resection for a left sphenoid wing meningioma. After his eye began swelling up and becoming red. He did not receive radiation at that time but, was monitored with serial MRI's.   He relocated to [place] in 2007. He did not undergo Brain MRI between 2012 and 2015. The patient was noted to have slow progression of his meningioma by Dr. Alycia Patten of neurosurgery at [facility]. He did not have any new symptoms at that time. The patient received radiation treatment 11/01/2015 - 12/13/2015 to the left sphenoid wing for a total dose of 54 Gy in 30 fractions with Dr. Tyler Pita.   Most recent Brain MRI on 04/23/2017 shows interval stability of left sphenoid wing meningioma. MRI Face Trigeminal shows overall findings consistent with interval stability of the treated left sphenoid wing meningioma and, no clear progression of tumor in the intracranial compartment, orbit, sella, infratemporal fossa, or paranasal sinuses. Current follow up includes Brain MRI every 6 months and continued follow-up in radiation oncology.   The patient has a history of diabetes and high cholesterol which is well controlled. He is seen at [facility] for primary care. He is a non-smoker and social drinker.   The patient  has no new concerns or complaints since he was last seen in radiation oncology in July 2018. He denies any neurological symptoms related to his tumor. He denies having received chemotherapy treatment for his meningioma. He has been doing well since radiation treatment. He has not seen a neurosurgeon locally. The patient currently sees his eye doctor every 6 months.   Denies any problems with headaches. Denies any change in sensation over the face. Denies seizures, balance issues, or change in field of vision. Denies abdominal pain. No changes in vision.   MEDICAL HISTORY:  Past Medical History:  Diagnosis Date   H/O leukemia    Meningioma ([place])   h/o Childhood ALL DM2  SURGICAL HISTORY: Past Surgical History:  Procedure Laterality Date   CRANIOTOMY      SOCIAL HISTORY: Social History   Social History   Marital status: Married    Spouse name: N/A   Number of children: N/A   Years of education: N/A   Occupational History   Not on file.   Social History Main Topics   Smoking status: Never Smoker   Smokeless tobacco: Never Used   Alcohol use No   Drug use: No   Sexual activity: Yes   Other Topics Concern   Not on file   Social History Narrative   No narrative on file    FAMILY HISTORY: Family History  Problem Relation Age of Onset   Diabetes Mother     ALLERGIES:  has No Known Allergies.  MEDICATIONS:  Current Outpatient Prescriptions  Medication Sig Dispense Refill  atorvastatin (LIPITOR) 10 MG tablet Take 10 mg by mouth daily.     metFORMIN (GLUCOPHAGE-XR) 500 MG 24 hr tablet      Multiple Vitamin (MULTIVITAMIN) tablet Take 1 tablet by mouth daily.     predniSONE (STERAPRED UNI-PAK 48 TAB) 10 MG (48) TBPK tablet Take by mouth daily. Following instructions on the dose pak for 11 days     Vitamin D, Ergocalciferol, (DRISDOL) 50000 units CAPS capsule TAKE 1 CAPSULE (50,000 UNITS TOTAL) BY MOUTH ONCE A WEEK.     No current  facility-administered medications for this visit.     REVIEW OF SYSTEMS:    10 Point review of Systems was done is negative except as noted above.  PHYSICAL EXAMINATION: ECOG PERFORMANCE STATUS: 1 - Symptomatic but completely ambulatory  . Vitals:   07/02/17 1119  BP: 119/81  Pulse: 67  Resp: 20  Temp: 98.2 F (36.8 C)  SpO2: 100%   Filed Weights   07/02/17 1119  Weight: 173 lb 14.4 oz (78.9 kg)   .Body mass index is 27.24 kg/m.  GENERAL:alert, in no acute distress and comfortable SKIN: no acute rashes, no significant lesions EYES: left eye mild proptosis and conjunctival congestion. Normal EOM. OROPHARYNX: MMM, no exudates, no oropharyngeal erythema or ulceration NECK: supple, no JVD LYMPH:  no palpable lymphadenopathy in the cervical, axillary or inguinal regions LUNGS: clear to auscultation b/l with normal respiratory effort HEART: regular rate & rhythm ABDOMEN:  normoactive bowel sounds , non tender, not distended. Extremity: no pedal edema PSYCH: alert & oriented x 3 with fluent speech NEURO: no overt focal motor/sensory deficits  LABORATORY DATA:  I have reviewed the data as listed  .No flowsheet data found.  . CMP Latest Ref Rng & Units 10/16/2015 10/12/2015  BUN 7.0 - 26.0 mg/dL 15.6 15.5  Creatinine 0.7 - 1.3 mg/dL 1.1 1.2     RADIOGRAPHIC STUDIES: I have personally reviewed the radiological images as listed and agreed with the findings in the report. No results found.  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 Treated with surgical excision in 1995 in [place] Progression in 2017 and received radiation treatment with Dr. Tammi Klippel 11/01/2015 - 12/13/2015. Plan -patient today has no clinical/neurologic or occular symptoms suggestive of disease progression at this time. - He continues to follow-up every 6 months in  radiation oncology.  -Currently undergoes Brain MRIs every 6 months. Most recent Brain MRI in July 2018 showed interval stability of left sphenoid wing meningioma and no clear progression of tumor.  -We discussed potential considerationfor Avastin if recurrence develops. -Recommended long term follow-up care with neuro-oncologist Dr. Mickeal Skinner. The patient would like to proceed with referral to Dr. Mickeal Skinner.  PLAN: -Referral to neuro-oncologist Dr. Mickeal Skinner for follow-up in 3 months for long-term care and neuro checks to watch for recurrence. -He will continue with Brain MRIs every 6 months as recommended by radiation oncology.  -I will see him on an as needed basis.  All of the patients questions were answered with apparent satisfaction. The patient knows to call the clinic with any problems, questions or concerns.  I spent 40 minutes counseling the patient face to face. The total time spent in the appointment was 60 minutes and more than 50% was on counseling and direct patient cares.   This document serves as a record of services personally performed by Sullivan Lone, MD. It  was created on his behalf by Arlyce Harman, a trained medical scribe. The creation of this record is based on the scribe's personal observations and the provider's statements to them. This document has been checked and approved by the attending provider.  Sullivan Lone MD [place] AAHIVMS [facility] Hematology/Oncology Physician [facility]  (Office):       [phone] (Work cell):  [phone] (Fax):           [phone]  07/02/2017 11:35 AM

## 2017-07-02 ENCOUNTER — Encounter: Payer: Self-pay | Admitting: Hematology

## 2017-07-02 ENCOUNTER — Ambulatory Visit (HOSPITAL_BASED_OUTPATIENT_CLINIC_OR_DEPARTMENT_OTHER): Payer: 59 | Admitting: Hematology

## 2017-07-02 ENCOUNTER — Telehealth: Payer: Self-pay | Admitting: Hematology

## 2017-07-02 VITALS — BP 119/81 | HR 67 | Temp 98.2°F | Resp 20 | Ht 67.0 in | Wt 173.9 lb

## 2017-07-02 DIAGNOSIS — D420 Neoplasm of uncertain behavior of cerebral meninges: Secondary | ICD-10-CM

## 2017-07-02 NOTE — Telephone Encounter (Signed)
Scheduled appt per 9/20 los - per 9/20 los f/u with Dr. Mickeal Skinner - Gave patient AVS and calender.

## 2017-09-14 ENCOUNTER — Other Ambulatory Visit: Payer: Self-pay | Admitting: *Deleted

## 2017-09-14 DIAGNOSIS — D329 Benign neoplasm of meninges, unspecified: Secondary | ICD-10-CM

## 2017-09-25 ENCOUNTER — Other Ambulatory Visit: Payer: Self-pay

## 2017-09-25 DIAGNOSIS — D329 Benign neoplasm of meninges, unspecified: Secondary | ICD-10-CM

## 2017-09-28 ENCOUNTER — Other Ambulatory Visit: Payer: 59

## 2017-09-28 ENCOUNTER — Ambulatory Visit: Payer: 59 | Admitting: Internal Medicine

## 2017-10-01 ENCOUNTER — Ambulatory Visit: Payer: 59 | Admitting: Internal Medicine

## 2017-10-22 ENCOUNTER — Ambulatory Visit
Admission: RE | Admit: 2017-10-22 | Discharge: 2017-10-22 | Disposition: A | Discharge status: Home / Self Care | Payer: 59 | Source: Ambulatory Visit | Attending: Radiation Oncology | Admitting: Radiation Oncology

## 2017-10-22 DIAGNOSIS — D329 Benign neoplasm of meninges, unspecified: Secondary | ICD-10-CM

## 2017-10-22 MED ORDER — GADOBENATE DIMEGLUMINE 529 MG/ML IV SOLN
16.0000 mL | Freq: Once | INTRAVENOUS | Status: AC | PRN
  Administered 2017-10-22: 16 mL via INTRAVENOUS

## 2017-10-26 ENCOUNTER — Ambulatory Visit: Admission: RE | Admit: 2017-10-26 | Payer: 59 | Source: Ambulatory Visit | Admitting: Radiation Oncology

## 2017-10-26 ENCOUNTER — Ambulatory Visit: Payer: 59 | Admitting: Internal Medicine

## 2017-10-26 ENCOUNTER — Telehealth: Payer: Self-pay | Admitting: Radiation Oncology

## 2017-10-26 NOTE — Telephone Encounter (Signed)
Patient hasn't arrived for 0845 follow up appointment. Phoned patient to inquire. He states, "I forgot I am so sorry I got busy with end of year stuff at work." Patient understands this RN will inform Mont Dutton of these findings and she will reach out to reschedule.

## 2017-10-27 ENCOUNTER — Ambulatory Visit: Payer: Self-pay | Admitting: Urology

## 2017-10-28 ENCOUNTER — Other Ambulatory Visit: Payer: Self-pay

## 2017-10-28 ENCOUNTER — Ambulatory Visit
Admission: RE | Admit: 2017-10-28 | Discharge: 2017-10-28 | Disposition: A | Discharge status: Home / Self Care | Payer: 59 | Source: Ambulatory Visit | Attending: Radiation Oncology | Admitting: Radiation Oncology

## 2017-10-28 ENCOUNTER — Telehealth: Payer: Self-pay | Admitting: Internal Medicine

## 2017-10-28 ENCOUNTER — Inpatient Hospital Stay: Payer: 59 | Attending: Internal Medicine | Admitting: Internal Medicine

## 2017-10-28 ENCOUNTER — Encounter: Payer: Self-pay | Admitting: Radiation Oncology

## 2017-10-28 DIAGNOSIS — H0520 Unspecified exophthalmos: Secondary | ICD-10-CM | POA: Insufficient documentation

## 2017-10-28 DIAGNOSIS — W888XXA Exposure to other ionizing radiation, initial encounter: Secondary | ICD-10-CM | POA: Insufficient documentation

## 2017-10-28 DIAGNOSIS — D329 Benign neoplasm of meninges, unspecified: Secondary | ICD-10-CM | POA: Insufficient documentation

## 2017-10-28 DIAGNOSIS — Z7984 Long term (current) use of oral hypoglycemic drugs: Secondary | ICD-10-CM | POA: Insufficient documentation

## 2017-10-28 DIAGNOSIS — G9389 Other specified disorders of brain: Secondary | ICD-10-CM | POA: Diagnosis not present

## 2017-10-28 DIAGNOSIS — Z79899 Other long term (current) drug therapy: Secondary | ICD-10-CM | POA: Insufficient documentation

## 2017-10-28 NOTE — Telephone Encounter (Signed)
Gave avs and calendar for july °

## 2017-10-28 NOTE — Progress Notes (Signed)
Radiation Oncology         [phone] ________________________________  Name: John Glover MRN: [id number]  Date: 10/28/2017  DOB: 10-12-1961    Follow-Up Visit Note  CC: Patient, No Pcp Per  Zagar, Kris Hartmann, MD  Diagnosis:   57 y.o. gentleman with recurrent left sphenoid wing meningioma.  Interval Since Last Radiation:  22 months  11/01/2015-12/13/2015: 54 Gy in 30 fractions of 1.8 Gy to the sphenoid on the left.  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   Narrative:  The patient returns today for routine follow up but to summarize his case he was treated surgically in 1995 in [place] for a grade 1 meningioma about the left sphenoid wing in [place]. He was found to have slow progression of tumor identified between 2009 and 2016 while following at [facility], and subsequently underwent radiotherapy to the sphenoid region for a meningioma which was involving the left orbital involvement. He has been without evidence of recurrence, and comes today for follow up MRI review. This was performed on 10/22/17, and did not reveal concerns of progression of his treated meningioma or of any new sites of meningioma.  On review of systems, the patient reports that Weight and vitals stable. Denies facial pain or discomfort. He continues to reports chronic left dry eye has improved slightly. Reports he continues manage dry eye with OTC drops and antihistamines. Denies any neurological symptoms related to his tumor. Currently the patient sees his eye doctor every six months. Denies headaches. Denies any change in sensation over his face. Denies seizures, balance issues or change in field of vision. Left eye with mild proptosis. Denies conjunctival congestion. Denies chest pain, shortness of breath, cough, fevers, chills, night sweats, abdominal pain, nausea or vomiting. Reports diarrhea related to effect of Metformin. He denies new musculoskeletal or joint aches or pains. No new  skin lesions or concerns. Patient scheduled to meet with Dr. Mickeal Skinner, neuro-oncologist, for the first time today.   Past Medical History:  Past Medical History:  Diagnosis Date  . H/O leukemia   . [facility])     Past Surgical History: Past Surgical History:  Procedure Laterality Date  . CRANIOTOMY      Social History:  Social History   Socioeconomic History  . Marital status: Married    Spouse name: Not on file  . Number of children: Not on file  . Years of education: Not on file  . Highest education level: Not on file  Social Needs  . Financial resource strain: Not on file  . Food insecurity - worry: Not on file  . Food insecurity - inability: Not on file  . Transportation needs - medical: Not on file  . Transportation needs - non-medical: Not on file  Occupational History  . Not on file  Tobacco Use  . Smoking status: Never Smoker  . Smokeless tobacco: Never Used  Substance and Sexual Activity  . Alcohol use: No  . Drug use: No  . Sexual activity: Yes  Other Topics Concern  . Not on file  Social History Narrative  . Not on file  The patient lives in [place]. 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  Family History: Family History  Problem Relation Age of Onset  . Diabetes Mother     ALLERGIES:  has No Known Allergies.  Meds: Current Outpatient Medications  Medication Sig Dispense Refill  . atorvastatin (LIPITOR) 20 MG tablet Take 20 mg by mouth daily.  5  . cholecalciferol (VITAMIN D) 1000 units tablet Take by mouth.    . metFORMIN (GLUCOPHAGE) 1000 MG tablet Take 1,000 mg by mouth 2 (two) times daily with a meal.    . Multiple Vitamin (MULTIVITAMIN) tablet Take 1 tablet by mouth daily.     No current facility-administered medications for this encounter.     Physical Findings:  weight is 169 lb 9.6 oz (76.9 kg). His oral temperature is 98 F (36.7 C). His blood pressure is 119/87 and his pulse is 83. His respiration is 16 and oxygen saturation is  100%.   Pain scale 0/10 In general this is a well appearing Hispanic male in no acute distress. He's alert and oriented x4 and appropriate throughout the examination. Cardiopulmonary assessment is negative for acute distress and he exhibits normal effort. HEENT reveals that he is normocephalic atraumatic, proptosis of the left eye is noted and is stable from previous. There is less scleral injection than previous, and again without hemorrhage. This craniotomy incision is well-healed.   Lab Findings: No results found for: WBC, HGB, HCT, PLT  Lab Results  Component Value Date   BUN 15.6 10/16/2015   CREATININE 1.1 10/16/2015    Radiographic Findings: [name] Contrast  Result Date: 10/23/2017 CLINICAL DATA:  SRS for sphenoid wing meningioma Creatinine was obtained on site at [facility] at [address] Results: Creatinine 0.9 mg/dL. EXAM: MRI HEAD WITHOUT AND WITH CONTRAST TECHNIQUE: Multiplanar, multiecho pulse sequences of the brain and surrounding structures were obtained without and with intravenous contrast. CONTRAST:  88mL MULTIHANCE GADOBENATE DIMEGLUMINE 529 MG/ML IV SOLN COMPARISON:  MRI brain 04/23/2017.  MRI brain 09/17/2017. FINDINGS: Brain: The left sphenoid wing meningioma scratched at the infiltrative left sphenoid wing meningioma has not changed significantly. Tumor extends into the left sphenoid sinus involve the orbital apex. There is residual tumor along the anterior aspect of the middle cranial fossa over the sphenoid wing. Tumor is noted along the lateral orbital wall. No tumor progression is present.  There is no secondary sites tumor. No acute infarct or hemorrhage is present. Focal encephalomalacia of the anterior frontal lobes bilaterally, left greater than right, is stable. No new cortical lesion or white matter disease is present. The brainstem and cerebellum are normal. Vascular: Flow is present in the major intracranial arteries. Skull and upper cervical  spine: The skull base is otherwise unremarkable. The craniocervical junction is normal. Midline sagittal structures are otherwise within normal limits. Sinuses/Orbits: Tumor invades the left sphenoid sinus without change. The orbital apex is involved. Asymmetric left-sided exophthalmos is stable. Bilateral maxillary antrostomies are present. Recurrent mucosal thickening is present in the maxillary sinuses, right greater than left. Partial ethmoidectomies are noted. There is minimal fluid in the mastoid air cells inferiorly. No obstructing nasopharyngeal lesion is present. IMPRESSION: 1. Stable appearance of infiltrative left sphenoid wing meningioma with extension to the left orbital apex and left sphenoid sinus. 2. No evidence for tumor progression or new tumor. 3. Stable asymmetric left-sided exophthalmos secondary to mass effect. 4. Recurrent sinus disease despite maxillary antrostomies. Electronically Signed   By: San Morelle M.D.   On: 10/23/2017 08:42    Impression/Plan: 1.  57 y.o. man with a recurrent meningioma s/p surgical resection and salvage radiotherapy. We reviewed the patient's imaging studies.  He'll meet Dr. Mickeal Skinner later today.  Would likely recommend continued imaging surveillance at 6-12 month intervals and possible neuroendocrine screening for pituitary effects from radiation.  ------------------------------------------------   Rodman Key  Tammi Klippel, MD [place] Director and Director of Stereotactic Radiosurgery Direct Dial: [phone]  Fax: [phone] [email or web address]  Skype  LinkedIn

## 2017-10-28 NOTE — Progress Notes (Signed)
[facility] [place], [place] [address] [phone]   New Patient Evaluation  Date of Service: 10/28/17 Patient Name: John Glover Patient MRN: [id number] Patient DOB: 1961-01-07 Provider: Ventura Sellers, MD  Identifying Statement:  John Glover is a 57 y.o. 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    History of Present Illness: The patient's records from the referring physician were obtained and reviewed and the patient interviewed to confirm this HPI.  John Glover initially presented to medical attention in 1995 after experiencing "bulging" of his left eye, which prompted an MRI brain demonstrating a skull base meningioma.  This was resected in [place], where he lived at the time.  Unfortunately no images are available for review from this period.  The lesion was followed serially until progression/growth was noted in 2014.  Ultimately, there was no surgical option 2/2 tumor location, and IMRT radiation was performed by Dr. Tammi Klippel ending 12/13/15.  He tolerated this treatment well with only some mild worsening of proptosis.  At baseline he has mild sensory impairment in the left lower face only.  He is functional and independent, working full time in [employer], [employer].  He has noted history of PCI irradiation at age 57 as part of therapy for ALL leukemia.  Medications: Current Outpatient Medications on File Prior to Visit  Medication Sig Dispense Refill  . atorvastatin (LIPITOR) 20 MG tablet Take 20 mg by mouth daily.  5  . cholecalciferol (VITAMIN D) 1000 units tablet Take by mouth.    . metFORMIN (GLUCOPHAGE) 1000 MG tablet Take 1,000 mg by mouth 2 (two) times daily with a meal.    . Multiple Vitamin (MULTIVITAMIN) tablet Take 1 tablet by mouth daily.     No current facility-administered medications on file prior to visit.     Allergies: No Known Allergies Past Medical History:  Past  Medical History:  Diagnosis Date  . H/O leukemia   . Meningioma [facility])    Past Surgical History:  Past Surgical History:  Procedure Laterality Date  . CRANIOTOMY     Social History:  Social History   Socioeconomic History  . Marital status: Married    Spouse name: Not on file  . Number of children: Not on file  . Years of education: Not on file  . Highest education level: Not on file  Social Needs  . Financial resource strain: Not on file  . Food insecurity - worry: Not on file  . Food insecurity - inability: Not on file  . Transportation needs - medical: Not on file  . Transportation needs - non-medical: Not on file  Occupational History  . Not on file  Tobacco Use  . Smoking status: Never Smoker  . Smokeless tobacco: Never Used  Substance and Sexual Activity  . Alcohol use: No  . Drug use: No  . Sexual activity: Yes  Other Topics Concern  . Not on file  Social History Narrative  . Not on file   Family History:  Family History  Problem Relation Age of Onset  . Diabetes Mother     Review of Systems: Constitutional: Denies fevers, chills or abnormal weight loss Eyes: Denies blurriness of vision Ears, nose, mouth, throat, and face: Denies mucositis or sore throat Respiratory: Denies cough, dyspnea or wheezes Cardiovascular: Denies palpitation, chest discomfort or lower extremity swelling Gastrointestinal:  Denies nausea, constipation, diarrhea GU: Denies dysuria or incontinence Skin: Denies abnormal skin rashes  Neurological: Per HPI Musculoskeletal: Denies joint pain, back or neck discomfort. No decrease in ROM Behavioral/Psych: Denies anxiety, disturbance in thought content, and mood instability  Physical Exam: There were no vitals filed for this visit. KPS: 100. General: Alert, cooperative, pleasant, in no acute distress Head: Normal EENT: No conjunctival injection or scleral icterus. Oral mucosa moist Lungs: Resp effort normal Cardiac: Regular rate and  rhythm Abdomen: Soft, non-distended abdomen Skin: No rashes cyanosis or petechiae. Extremities: No clubbing or edema  Neurologic Exam: Mental Status: Awake, alert, attentive to examiner. Oriented to self and environment. Language is fluent with intact comprehension.  Cranial Nerves: Visual acuity is grossly normal. Visual fields are full. Extra-ocular movements intact. No ptosis. Face is symmetric, tongue midline. Motor: Tone and bulk are normal. Power is full in both arms and legs. Reflexes are symmetric, no pathologic reflexes present. Intact finger to nose bilaterally Sensory: Intact to light touch and temperature Gait: Normal and tandem gait is normal.   Labs: I have reviewed the data as listed    Component Value Date/Time   BUN 15.6 10/16/2015 0804   CREATININE 1.1 10/16/2015 0804   No results found for: WBC, NEUTROABS, HGB, HCT, MCV, PLT  Imaging: [facility] Clinician Interpretation: I have personally reviewed the CNS images as listed.  My interpretation, in the context of the patient's clinical presentation, is stable disease  Mr John Glover Wo Contrast  Result Date: 10/23/2017 CLINICAL DATA:  SRS for sphenoid wing meningioma Creatinine was obtained on site at [facility] at [address] Results: Creatinine 0.9 mg/dL. EXAM: MRI HEAD WITHOUT AND WITH CONTRAST TECHNIQUE: Multiplanar, multiecho pulse sequences of the brain and surrounding structures were obtained without and with intravenous contrast. CONTRAST:  42mL MULTIHANCE GADOBENATE DIMEGLUMINE 529 MG/ML IV SOLN COMPARISON:  MRI brain 04/23/2017.  MRI brain 09/17/2017. FINDINGS: Brain: The left sphenoid wing meningioma scratched at the infiltrative left sphenoid wing meningioma has not changed significantly. Tumor extends into the left sphenoid sinus involve the orbital apex. There is residual tumor along the anterior aspect of the middle cranial fossa over the sphenoid wing. Tumor is noted along the lateral orbital wall. No  tumor progression is present.  There is no secondary sites tumor. No acute infarct or hemorrhage is present. Focal encephalomalacia of the anterior frontal lobes bilaterally, left greater than right, is stable. No new cortical lesion or white matter disease is present. The brainstem and cerebellum are normal. Vascular: Flow is present in the major intracranial arteries. Skull and upper cervical spine: The skull base is otherwise unremarkable. The craniocervical junction is normal. Midline sagittal structures are otherwise within normal limits. Sinuses/Orbits: Tumor invades the left sphenoid sinus without change. The orbital apex is involved. Asymmetric left-sided exophthalmos is stable. Bilateral maxillary antrostomies are present. Recurrent mucosal thickening is present in the maxillary sinuses, right greater than left. Partial ethmoidectomies are noted. There is minimal fluid in the mastoid air cells inferiorly. No obstructing nasopharyngeal lesion is present. IMPRESSION: 1. Stable appearance of infiltrative left sphenoid wing meningioma with extension to the left orbital apex and left sphenoid sinus. 2. No evidence for tumor progression or new tumor. 3. Stable asymmetric left-sided exophthalmos secondary to mass effect. 4. Recurrent sinus disease despite maxillary antrostomies. Electronically Signed   By: San Morelle M.D.   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  This meningioma is "secondary"  given his exposure to ionizing radiation in childhood.  This puts him at higher risk of additional tumors including gliomas, warranting indefinite close follow up.    At this time we recommend repeat MRI brain in 6 months for evaluation.  We appreciate the opportunity to participate in the care of John Glover.   Screening for  potential clinical trials was performed and discussed using eligibility criteria for active protocols at [facility], loco-regional tertiary centers, as well as national database available on [email or web address].    The patient is not a candidate for a research protocol at this time due to stable disease.   We spent twenty additional minutes teaching regarding the natural history, biology, and historical experience in the treatment of brain tumors. We then discussed in detail the current recommendations for therapy focusing on the mode of administration, mechanism of action, anticipated toxicities, and quality of life issues associated with this plan. We also provided teaching sheets for the patient to take home as an additional resource.  All questions were answered. The patient knows to call the clinic with any problems, questions or concerns. No barriers to learning were detected.  The total time spent in the encounter was 45 minutes and more than 50% was on counseling and review of test results   Ventura Sellers, MD Medical Director of Neuro-Oncology [facility] [place] 10/28/17 10:49 AM

## 2018-03-16 ENCOUNTER — Other Ambulatory Visit: Payer: Self-pay

## 2018-04-23 ENCOUNTER — Ambulatory Visit
Admission: RE | Admit: 2018-04-23 | Discharge: 2018-04-23 | Disposition: A | Discharge status: Home / Self Care | Payer: 59 | Source: Ambulatory Visit | Attending: Internal Medicine | Admitting: Internal Medicine

## 2018-04-23 DIAGNOSIS — D329 Benign neoplasm of meninges, unspecified: Secondary | ICD-10-CM

## 2018-04-23 MED ORDER — GADOBENATE DIMEGLUMINE 529 MG/ML IV SOLN
16.0000 mL | Freq: Once | INTRAVENOUS | Status: AC | PRN
  Administered 2018-04-23: 16 mL via INTRAVENOUS

## 2018-04-27 ENCOUNTER — Encounter: Payer: Self-pay | Admitting: Internal Medicine

## 2018-04-27 ENCOUNTER — Other Ambulatory Visit: Payer: Self-pay

## 2018-04-27 ENCOUNTER — Inpatient Hospital Stay: Payer: 59 | Attending: Internal Medicine | Admitting: Internal Medicine

## 2018-04-27 ENCOUNTER — Telehealth: Payer: Self-pay | Admitting: Internal Medicine

## 2018-04-27 VITALS — BP 109/79 | HR 71 | Temp 98.4°F | Resp 18 | Ht 67.0 in | Wt 167.4 lb

## 2018-04-27 DIAGNOSIS — D320 Benign neoplasm of cerebral meninges: Secondary | ICD-10-CM

## 2018-04-27 DIAGNOSIS — D329 Benign neoplasm of meninges, unspecified: Secondary | ICD-10-CM

## 2018-04-27 DIAGNOSIS — R200 Anesthesia of skin: Secondary | ICD-10-CM

## 2018-04-27 DIAGNOSIS — D420 Neoplasm of uncertain behavior of cerebral meninges: Secondary | ICD-10-CM

## 2018-04-27 NOTE — Progress Notes (Signed)
[facility] [place],  [address] [phone]   Interval Evaluation  Date of Service: 04/27/18 Patient Name: John Glover Patient MRN: [id number] Patient DOB: 1960/12/10 Provider: Ventura Sellers, MD  Identifying Statement:  John Glover is a 57 y.o. male with atypical skull base meningioma   Interval History:  John Glover presents today for follow up after recent MRI.  He describes no new or progressive neurologic deficits.  Continues to have mild left lower face numbness but no double vision.  He is functional and independent, working full time in [employer], [employer].  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  Medications: Current Outpatient Medications on File Prior to Visit  Medication Sig Dispense Refill  . atorvastatin (LIPITOR) 20 MG tablet Take 20 mg by mouth daily.  5  . cholecalciferol (VITAMIN D) 1000 units tablet Take by mouth.    . metFORMIN (GLUCOPHAGE) 1000 MG tablet Take 1,000 mg by mouth 2 (two) times daily with a meal.    . Multiple Vitamin (MULTIVITAMIN) tablet Take 1 tablet by mouth daily.     No current facility-administered medications on file prior to visit.     Allergies: No Known Allergies Past Medical History:  Past Medical History:  Diagnosis Date  . H/O leukemia   . Meningioma [facility])    Past Surgical History:  Past Surgical History:  Procedure Laterality Date  . CRANIOTOMY     Social History:  Social History   Socioeconomic History  . Marital status: Married    Spouse name: Not on file  . Number of children: Not on file  . Years of education: Not on file  . Highest education level: Not on file  Occupational History  . Not on file  Social Needs  . Financial resource strain: Not on file  . Food insecurity:    Worry: Not on file    Inability: Not on file  . Transportation needs:    Medical: Not on file    Non-medical: Not on file    Tobacco Use  . Smoking status: Never Smoker  . Smokeless tobacco: Never Used  Substance and Sexual Activity  . Alcohol use: No  . Drug use: No  . Sexual activity: Yes  Lifestyle  . Physical activity:    Days per week: Not on file    Minutes per session: Not on file  . Stress: Not on file  Relationships  . Social connections:    Talks on phone: Not on file    Gets together: Not on file    Attends religious service: Not on file    Active member of club or organization: Not on file    Attends meetings of clubs or organizations: Not on file    Relationship status: Not on file  . Intimate partner violence:    Fear of current or ex partner: Not on file    Emotionally abused: Not on file    Physically abused: Not on file    Forced sexual activity: Not on file  Other Topics Concern  . Not on file  Social History Narrative  . Not on file   Family History:  Family History  Problem Relation Age of Onset  . Diabetes Mother     Review of Systems: Constitutional: Denies fevers, chills or abnormal weight loss Eyes: Denies blurriness of vision Ears, nose, mouth, throat, and face: Denies mucositis or sore  throat Respiratory: Denies cough, dyspnea or wheezes Cardiovascular: Denies palpitation, chest discomfort or lower extremity swelling Gastrointestinal:  Denies nausea, constipation, diarrhea GU: Denies dysuria or incontinence Skin: Denies abnormal skin rashes Neurological: Per HPI Musculoskeletal: Denies joint pain, back or neck discomfort. No decrease in ROM Behavioral/Psych: Denies anxiety, disturbance in thought content, and mood instability  Physical Exam: Vitals:   04/27/18 0920  BP: 109/79  Pulse: 71  Resp: 18  Temp: 98.4 F (36.9 C)  SpO2: 100%   KPS: 100. General: Alert, cooperative, pleasant, in no acute distress Head: Normal EENT: No conjunctival injection or scleral icterus. Oral mucosa moist Lungs: Resp effort normal Cardiac: Regular rate and  rhythm Abdomen: Soft, non-distended abdomen Skin: No rashes cyanosis or petechiae. Extremities: No clubbing or edema  Neurologic Exam: Mental Status: Awake, alert, attentive to examiner. Oriented to self and environment. Language is fluent with intact comprehension.  Cranial Nerves: Visual acuity is grossly normal. Visual fields are full. Extra-ocular movements intact. No ptosis. Face is symmetric, tongue midline. Motor: Tone and bulk are normal. Power is full in both arms and legs. Reflexes are symmetric, no pathologic reflexes present. Intact finger to nose bilaterally Sensory: Intact to light touch and temperature Gait: Normal and tandem gait is normal.   Labs: I have reviewed the data as listed    Component Value Date/Time   BUN 15.6 10/16/2015 0804   CREATININE 1.1 10/16/2015 0804   No results found for: WBC, NEUTROABS, HGB, HCT, MCV, PLT  Imaging: [facility] Clinician Interpretation: I have personally reviewed the CNS images as listed.  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 Prior surgery in 1995. Subsequent growth detected, and treated with IMRT radiation in March 2017. Restaging. Creatinine was obtained on site at [facility] at [address] Results: Creatinine 1.0 mg/dL. EXAM: MRI HEAD WITHOUT AND WITH CONTRAST TECHNIQUE: Multiplanar, multiecho pulse sequences of the brain and surrounding structures were obtained without and with intravenous contrast. CONTRAST:  19mL MULTIHANCE GADOBENATE DIMEGLUMINE 529 MG/ML IV SOLN COMPARISON:  10/22/2017 and earlier. FINDINGS: Brain: Large lobulated and stellate multi spatial infiltrating left skull base meningioma redemonstrated and appears stable in size and configuration since 10/10/2015. Involvement of the left cavernous sinus, left  sella turcica, left sphenoid sinus, clivus, superior left orbit, and left masticator space as before. Stable enhancement pattern compared to 2016. Mildly lobulated dural thickening extending superiorly and posteriorly from the left anterior middle cranial fossa is stable to mildly decreased since 2016. Overlying frontotemporal craniotomy changes again noted. Mild anterior frontal lobe encephalomalacia is unchanged. Stable gray and white matter signal elsewhere. No cerebral edema identified. No new abnormal enhancement. No restricted diffusion to suggest acute infarction. No midline shift, ventriculomegaly, extra-axial collection or acute intracranial hemorrhage. However, several chronic micro hemorrhages have developed since 2016 seen in the right frontal lobe on series 6, images 19 and 20. No other No new signal abnormality. Cervicomedullary junction within normal limits. Partially empty sella, stable pituitary. Vascular: Major intracranial vascular flow voids are stable. The major dural venous sinuses are enhancing and remain patent; left cavernous sinus involvement as above. Skull and upper cervical spine: Stable bone marrow signal since 2016. Negative posterior fossa and visible cervical spine. Sinuses/Orbits: Chronic left exophthalmos. Stable orbits soft tissues, including tumor infiltration of the left orbit superior extraconal space with mild mass  effect. The right orbit remains normal. Stable paranasal sinuses aside from regression of maxillary mucosal thickening. Other: Mastoid air cells remain clear. Visible internal auditory structures appear normal. Stable scalp soft tissues. IMPRESSION: 1. Extensive trans-spatial left skull base meningioma is stable in size and configuration since 2016/2017. 2. No new intracranial abnormality. Electronically Signed   By: Genevie Ann M.D.   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 Bearman is clinically and radiographically stable today.  At this time we recommend repeat MRI brain in 6 months for evaluation.  We appreciate the opportunity to participate in the care of [facility].   All questions were answered. The patient knows to call the clinic with any problems, questions or concerns. No barriers to learning were detected.  The total time spent in the encounter was 25 minutes and more than 50% was on counseling and review of test results   Ventura Sellers, MD Medical Director of Neuro-Oncology [facility] at [place] 04/27/18 9:08 AM

## 2018-04-27 NOTE — Telephone Encounter (Signed)
Gave patient avs report and appointments for January 2020.  °

## 2018-08-26 ENCOUNTER — Telehealth: Payer: Self-pay | Admitting: *Deleted

## 2018-08-26 NOTE — Telephone Encounter (Signed)
attempted to notify patient via phone regarding MRI appointment made for him.  Voicemail full, unable to leave message.  Mailed appointments to address on file.

## 2018-10-21 ENCOUNTER — Other Ambulatory Visit: Payer: Self-pay | Admitting: Radiation Therapy

## 2018-10-22 ENCOUNTER — Ambulatory Visit
Admission: RE | Admit: 2018-10-22 | Discharge: 2018-10-22 | Disposition: A | Discharge status: Home / Self Care | Payer: 59 | Source: Ambulatory Visit | Attending: Internal Medicine | Admitting: Internal Medicine

## 2018-10-22 DIAGNOSIS — D329 Benign neoplasm of meninges, unspecified: Secondary | ICD-10-CM

## 2018-10-22 DIAGNOSIS — D420 Neoplasm of uncertain behavior of cerebral meninges: Secondary | ICD-10-CM

## 2018-10-22 MED ORDER — GADOBENATE DIMEGLUMINE 529 MG/ML IV SOLN
15.0000 mL | Freq: Once | INTRAVENOUS | Status: AC | PRN
  Administered 2018-10-22: 15 mL via INTRAVENOUS

## 2018-10-25 ENCOUNTER — Inpatient Hospital Stay: Payer: 59 | Attending: Radiation Oncology

## 2018-10-25 DIAGNOSIS — D320 Benign neoplasm of cerebral meninges: Secondary | ICD-10-CM | POA: Insufficient documentation

## 2018-10-26 ENCOUNTER — Telehealth: Payer: Self-pay

## 2018-10-26 ENCOUNTER — Inpatient Hospital Stay: Payer: 59 | Admitting: Internal Medicine

## 2018-10-26 ENCOUNTER — Encounter: Payer: Self-pay | Admitting: Internal Medicine

## 2018-10-26 VITALS — BP 112/91 | HR 75 | Temp 98.3°F | Resp 18 | Ht 69.0 in | Wt 170.2 lb

## 2018-10-26 DIAGNOSIS — D329 Benign neoplasm of meninges, unspecified: Secondary | ICD-10-CM

## 2018-10-26 DIAGNOSIS — D320 Benign neoplasm of cerebral meninges: Secondary | ICD-10-CM | POA: Diagnosis not present

## 2018-10-26 NOTE — Progress Notes (Signed)
[facility] at [place], [place] [address] [phone]   Interval Evaluation  Date of Service: 10/26/18 Patient Name: John Glover Patient MRN: [id number] Patient DOB: Jun 19, 1961 Provider: Ventura Sellers, MD  Identifying Statement:  Esten Dollar is a 58 y.o. male with atypical skull base meningioma   Interval History:  Cameren Earnest presents today for follow up after recent MRI.  He describes no new or progressive neurologic deficits.  Continues to have mild left lower face numbness but no double vision.  He is functional and independent, working full time in Engineer, mining.    Medications: Current Outpatient Medications on File Prior to Visit  Medication Sig Dispense Refill  . atorvastatin (LIPITOR) 20 MG tablet Take 20 mg by mouth daily.  5  . cholecalciferol (VITAMIN D) 1000 units tablet Take by mouth.    . metFORMIN (GLUCOPHAGE) 1000 MG tablet Take 1,000 mg by mouth 2 (two) times daily with a meal.    . Multiple Vitamin (MULTIVITAMIN) tablet Take 1 tablet by mouth daily.    . sildenafil (REVATIO) 20 MG tablet Take 1 tablet by mouth as needed.     No current facility-administered medications on file prior to visit.     Allergies: No Known Allergies Past Medical History:  Past Medical History:  Diagnosis Date  . H/O leukemia   . Meningioma [facility])    Past Surgical History:  Past Surgical History:  Procedure Laterality Date  . CRANIOTOMY     Social History:  Social History   Socioeconomic History  . Marital status: Married    Spouse name: Not on file  . Number of children: Not on file  . Years of education: Not on file  . Highest education level: Not on file  Occupational History  . Not on file  Social Needs  . Financial resource strain: Not on file  . Food insecurity:    Worry: Not on file    Inability: Not on file  . Transportation needs:    Medical: Not on file    Non-medical: Not on file  Tobacco Use  .  Smoking status: Never Smoker  . Smokeless tobacco: Never Used  Substance and Sexual Activity  . Alcohol use: No  . Drug use: No  . Sexual activity: Yes  Lifestyle  . Physical activity:    Days per week: Not on file    Minutes per session: Not on file  . Stress: Not on file  Relationships  . Social connections:    Talks on phone: Not on file    Gets together: Not on file    Attends religious service: Not on file    Active member of club or organization: Not on file    Attends meetings of clubs or organizations: Not on file    Relationship status: Not on file  . Intimate partner violence:    Fear of current or ex partner: Not on file    Emotionally abused: Not on file    Physically abused: Not on file    Forced sexual activity: Not on file  Other Topics Concern  . Not on file  Social History Narrative  . Not on file   Family History:  Family History  Problem Relation Age of Onset  . Diabetes Mother     Review of Systems: Constitutional: Denies fevers, chills or abnormal weight loss Eyes: Denies blurriness of vision Ears, nose, mouth, throat, and face: Denies mucositis or sore throat  Respiratory: Denies cough, dyspnea or wheezes Cardiovascular: Denies palpitation, chest discomfort or lower extremity swelling Gastrointestinal:  Denies nausea, constipation, diarrhea GU: Denies dysuria or incontinence Skin: Denies abnormal skin rashes Neurological: Per HPI Musculoskeletal: Denies joint pain, back or neck discomfort. No decrease in ROM Behavioral/Psych: Denies anxiety, disturbance in thought content, and mood instability  Physical Exam: Vitals:   10/26/18 0911  BP: (!) 112/91  Pulse: 75  Resp: 18  Temp: 98.3 F (36.8 C)  SpO2: 100%   KPS: 100. General: Alert, cooperative, pleasant, in no acute distress Head: Normal EENT: No conjunctival injection or scleral icterus. Oral mucosa moist Lungs: Resp effort normal Cardiac: Regular rate and rhythm Abdomen: Soft,  non-distended abdomen Skin: No rashes cyanosis or petechiae. Extremities: No clubbing or edema  Neurologic Exam: Mental Status: Awake, alert, attentive to examiner. Oriented to self and environment. Language is fluent with intact comprehension.  Cranial Nerves: Visual acuity is grossly normal. Visual fields are full. Extra-ocular movements intact. No ptosis. Face is symmetric, tongue midline. Motor: Tone and bulk are normal. Power is full in both arms and legs. Reflexes are symmetric, no pathologic reflexes present. Intact finger to nose bilaterally Sensory: Intact to light touch and temperature Gait: Normal and tandem gait is normal.   Labs: I have reviewed the data as listed    Component Value Date/Time   BUN 15.6 10/16/2015 0804   CREATININE 1.1 10/16/2015 0804   No results found for: WBC, NEUTROABS, HGB, HCT, MCV, PLT  Imaging: [facility] Clinician Interpretation: I have personally reviewed the CNS images as listed.  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 Prior surgery in 1995. Radiation therapy 2017. Restaging. Creatinine was obtained on site at [facility] at [address] Results: Creatinine 1.1 mg/dL. EXAM: MRI HEAD WITHOUT AND WITH CONTRAST TECHNIQUE: Multiplanar, multiecho pulse sequences of the brain and surrounding structures were obtained without and with intravenous contrast. CONTRAST:  39mL MULTIHANCE GADOBENATE DIMEGLUMINE 529 MG/ML IV SOLN COMPARISON:  04/23/2018 and 10/10/2015 MRI head. FINDINGS: Brain: T2 hyperintense, T1 hypointense neoplasm with reduced diffusion and enhancement centered in the left skull base involving left cavernous sinus, left sella turcica, left sphenoid sinus, clivus, superior left orbit, and left masticator space is stable in distribution in comparison with  prior MRI of the head. Stable left anterior middle cranial fossa lobulated dural thickening. Stable chronic frontotemporal craniotomy postsurgical changes. Stable mild left-greater-than-right anterior frontal lobe encephalomalacia. No acute stroke, hemorrhage, mass effect, hydrocephalus, extra-axial collection, or herniation of the brain. Scattered foci of chronic microhemorrhage are stable. No abnormal enhancement of brain parenchyma. Vascular: Normal flow voids. Skull and upper cervical spine: Normal marrow signal. Sinuses/Orbits: Stable chronic left exophthalmos. Normal right orbit. Bilateral maxillary antrostomy and ethmoidectomy chronic postsurgical changes. Moderate right and mild left sided maxillary sinus mucosal thickening. No abnormal signal of mastoid air cells. Other: None. IMPRESSION: 1. Stable left-sided skull base meningioma involving left cavernous sinus, left sella turcica, left sphenoid sinus, clivus, superior left orbit, left middle cranial fossa dura, and left masticator space. 2. Stable MRI of the brain. Electronically Signed   By: Kristine Garbe M.D.   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 Sagan is clinically and radiographically stable today.  At this time we recommend repeat MRI brain in  6 months for evaluation.  We appreciate the opportunity to participate in the care of [facility].   All questions were answered. The patient knows to call the clinic with any problems, questions or concerns. No barriers to learning were detected.  The total time spent in the encounter was 25 minutes and more than 50% was on counseling and review of test results   Ventura Sellers, MD Medical Director of Neuro-Oncology [facility] at [place] 10/26/18 9:02 AM

## 2018-10-26 NOTE — Progress Notes (Signed)
Brain and Spine Tumor Board Documentation  John Glover was presented by Cecil Cobbs, MD at Brain and Spine Tumor Board on 10/26/2018, which included representatives from neuro oncology, radiation oncology, surgical oncology, radiology, pathology, navigation.  John Glover was presented as a current patient with history of the following treatments: adjuvant radiation, surgical intervention(s).  Additionally, we reviewed previous medical and familial history, history of present illness, and recent lab results along with all available histopathologic and imaging studies. The tumor board considered available treatment options and made the following recommendations:  Active surveillance    Tumor board is a meeting of clinicians from various specialty areas who evaluate and discuss patients for whom a multidisciplinary approach is being considered. Final determinations in the plan of care are those of the provider(s). The responsibility for follow up of recommendations given during tumor board is that of the provider.   Today's extended care, comprehensive team conference, John Glover was not present for the discussion and was not examined.

## 2018-10-26 NOTE — Telephone Encounter (Signed)
Printed avs and calender of upcoming appointment. Per 1/14 los 

## 2018-10-27 ENCOUNTER — Ambulatory Visit: Payer: 59 | Admitting: Internal Medicine

## 2018-10-28 ENCOUNTER — Other Ambulatory Visit: Payer: Self-pay | Admitting: *Deleted

## 2019-04-26 ENCOUNTER — Ambulatory Visit: Payer: 59 | Admitting: Internal Medicine

## 2019-05-02 ENCOUNTER — Ambulatory Visit
Admission: RE | Admit: 2019-05-02 | Discharge: 2019-05-02 | Disposition: A | Discharge status: Home / Self Care | Payer: 59 | Source: Ambulatory Visit | Attending: Internal Medicine | Admitting: Internal Medicine

## 2019-05-02 DIAGNOSIS — D329 Benign neoplasm of meninges, unspecified: Secondary | ICD-10-CM

## 2019-05-02 MED ORDER — GADOBENATE DIMEGLUMINE 529 MG/ML IV SOLN
16.0000 mL | Freq: Once | INTRAVENOUS | Status: AC | PRN
  Administered 2019-05-02: 16 mL via INTRAVENOUS

## 2019-05-04 ENCOUNTER — Other Ambulatory Visit: Payer: Self-pay | Admitting: Radiation Therapy

## 2019-05-05 ENCOUNTER — Telehealth: Payer: Self-pay | Admitting: Internal Medicine

## 2019-05-05 ENCOUNTER — Other Ambulatory Visit: Payer: Self-pay

## 2019-05-05 ENCOUNTER — Inpatient Hospital Stay: Payer: 59 | Attending: Internal Medicine | Admitting: Internal Medicine

## 2019-05-05 VITALS — BP 115/78 | HR 89 | Temp 98.7°F | Resp 18 | Ht 69.0 in | Wt 168.3 lb

## 2019-05-05 DIAGNOSIS — D329 Benign neoplasm of meninges, unspecified: Secondary | ICD-10-CM

## 2019-05-05 DIAGNOSIS — D320 Benign neoplasm of cerebral meninges: Secondary | ICD-10-CM | POA: Diagnosis not present

## 2019-05-05 NOTE — Progress Notes (Signed)
[facility] at [place], [place] [address] [phone]   Interval Evaluation  Date of Service: 05/05/19 Patient Name: John Glover Patient MRN: [id number] Patient DOB: 08-05-1961 Provider: Ventura Sellers, MD  Identifying Statement:  John Glover is a 58 y.o. male with atypical skull base meningioma   Interval History:  John Glover presents today for follow up after recent MRI.  He describes no new or progressive neurologic deficits.  Continues to have mild left lower face numbness but no double vision.  He is functional and independent, working full time in Engineer, mining despite coronavirus shutdowns.    Medications: Current Outpatient Medications on File Prior to Visit  Medication Sig Dispense Refill   Alogliptin Benzoate 25 MG TABS Take 1 tablet by mouth daily.     atorvastatin (LIPITOR) 20 MG tablet Take 20 mg by mouth daily.  5   cholecalciferol (VITAMIN D) 1000 units tablet Take by mouth.     metFORMIN (GLUCOPHAGE) 1000 MG tablet Take 1,000 mg by mouth 2 (two) times daily with a meal.     Multiple Vitamin (MULTIVITAMIN) tablet Take 1 tablet by mouth daily.     sildenafil (REVATIO) 20 MG tablet Take 1 tablet by mouth as needed.     No current facility-administered medications on file prior to visit.     Allergies: No Known Allergies Past Medical History:  Past Medical History:  Diagnosis Date   H/O leukemia    Meningioma ([place])    Past Surgical History:  Past Surgical History:  Procedure Laterality Date   CRANIOTOMY     Social History:  Social History   Socioeconomic History   Marital status: Married    Spouse name: Not on file   Number of children: Not on file   Years of education: Not on file   Highest education level: Not on file  Occupational History   Not on file  Social Needs   Financial resource strain: Not on file   Food insecurity    Worry: Not on file    Inability: Not on file    Transportation needs    Medical: Not on file    Non-medical: Not on file  Tobacco Use   Smoking status: Never Smoker   Smokeless tobacco: Never Used  Substance and Sexual Activity   Alcohol use: No   Drug use: No   Sexual activity: Yes  Lifestyle   Physical activity    Days per week: Not on file    Minutes per session: Not on file   Stress: Not on file  Relationships   Social connections    Talks on phone: Not on file    Gets together: Not on file    Attends religious service: Not on file    Active member of club or organization: Not on file    Attends meetings of clubs or organizations: Not on file    Relationship status: Not on file   Intimate partner violence    Fear of current or ex partner: Not on file    Emotionally abused: Not on file    Physically abused: Not on file    Forced sexual activity: Not on file  Other Topics Concern   Not on file  Social History Narrative   Not on file   Family History:  Family History  Problem Relation Age of Onset   Diabetes Mother     Review of Systems: Constitutional: Denies fevers, chills or abnormal  weight loss Eyes: Denies blurriness of vision Ears, nose, mouth, throat, and face: Denies mucositis or sore throat Respiratory: Denies cough, dyspnea or wheezes Cardiovascular: Denies palpitation, chest discomfort or lower extremity swelling Gastrointestinal:  Denies nausea, constipation, diarrhea GU: Denies dysuria or incontinence Skin: Denies abnormal skin rashes Neurological: Per HPI Musculoskeletal: Denies joint pain, back or neck discomfort. No decrease in ROM Behavioral/Psych: Denies anxiety, disturbance in thought content, and mood instability  Physical Exam: Vitals:   05/05/19 0853  BP: 115/78  Pulse: 89  Resp: 18  Temp: 98.7 F (37.1 C)  SpO2: 99%   KPS: 100. General: Alert, cooperative, pleasant, in no acute distress Head: Normal EENT: No conjunctival injection or scleral icterus. Oral mucosa  moist Lungs: Resp effort normal Cardiac: Regular rate and rhythm Abdomen: Soft, non-distended abdomen Skin: No rashes cyanosis or petechiae. Extremities: No clubbing or edema  Neurologic Exam: Mental Status: Awake, alert, attentive to examiner. Oriented to self and environment. Language is fluent with intact comprehension.  Cranial Nerves: Visual acuity is grossly normal. Visual fields are full. Extra-ocular movements intact. No ptosis. Face is symmetric, tongue midline. Motor: Tone and bulk are normal. Power is full in both arms and legs. Reflexes are symmetric, no pathologic reflexes present. Intact finger to nose bilaterally Sensory: Intact to light touch and temperature Gait: Normal and tandem gait is normal.   Labs: I have reviewed the data as listed    Component Value Date/Time   BUN 15.6 10/16/2015 0804   CREATININE 1.1 10/16/2015 0804   No results found for: WBC, NEUTROABS, HGB, HCT, MCV, PLT  Imaging: [facility] Clinician Interpretation: I have personally reviewed the CNS images as listed.  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 Creatinine was obtained on site at [facility] at [address] Results: Creatinine 1.0 mg/dL. EXAM: MRI HEAD WITHOUT AND WITH CONTRAST TECHNIQUE: Multiplanar, multiecho pulse sequences of the brain and surrounding structures were obtained without and with intravenous contrast. CONTRAST:  32mL MULTIHANCE GADOBENATE DIMEGLUMINE 529 MG/ML IV SOLN COMPARISON:  10/22/2018 FINDINGS: Brain: There is no evidence of acute infarct or extra-axial fluid collection. Encephalomalacia anteriorly in the left greater than right frontal lobes is unchanged. Scattered chronic cerebral microhemorrhages are unchanged. The ventricles are normal in size. Enhancing, infiltrative  left skull base mass is again noted to involve the left sphenoid wing, left cavernous sinus, left sella, left sphenoid sinus, superolateral left orbit, and left infratemporal fossa, not significantly changed from 10/22/2018 and also without evidence of progression from 04/23/2017. Dural thickening extending superiorly from the left middle cranial fossa is unchanged as well. Vascular: Major intracranial vascular flow voids are preserved. Skull and upper cervical spine: Chronic involvement of the clivus by skull base tumor. Frontotemporal craniotomy. Sinuses/Orbits: Unchanged left exophthalmos. Postsurgical changes in the paranasal sinuses without evidence of acute inflammatory sinus disease. Clear mastoid air cells. Other: None. IMPRESSION: Unchanged left skull base meningioma. Electronically Signed   By: Logan Bores M.D.   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 Oelke is clinically and radiographically stable today.  At this time we recommend repeat MRI brain in 9 months for evaluation given prior recurrence extent of remaining bulky disease.  We appreciate the opportunity to participate in the care of [facility].  All questions were answered. The patient knows to call the clinic with any problems, questions or concerns. No barriers to learning were detected.  The total time spent in the encounter was 25 minutes and more than 50% was on counseling and review of test results   Ventura Sellers, MD Medical Director of Neuro-Oncology [facility] at [place] 05/05/19 8:59 AM

## 2019-05-05 NOTE — Telephone Encounter (Signed)
Scheduled appt per 7/23 los.  Printed and mailed appt calendar.

## 2019-12-20 ENCOUNTER — Other Ambulatory Visit: Payer: Self-pay | Admitting: Radiation Therapy

## 2020-01-25 ENCOUNTER — Ambulatory Visit
Admission: RE | Admit: 2020-01-25 | Discharge: 2020-01-25 | Disposition: A | Discharge status: Home / Self Care | Payer: 59 | Source: Ambulatory Visit | Attending: Internal Medicine | Admitting: Internal Medicine

## 2020-01-25 ENCOUNTER — Other Ambulatory Visit: Payer: Self-pay

## 2020-01-25 DIAGNOSIS — D329 Benign neoplasm of meninges, unspecified: Secondary | ICD-10-CM

## 2020-01-25 MED ORDER — GADOBENATE DIMEGLUMINE 529 MG/ML IV SOLN: 15.0000 mL | Freq: Once | INTRAVENOUS | Status: DC | PRN

## 2020-01-25 MED ORDER — GADOBENATE DIMEGLUMINE 529 MG/ML IV SOLN
15.0000 mL | Freq: Once | INTRAVENOUS | Status: AC | PRN
  Administered 2020-01-25: 15 mL via INTRAVENOUS

## 2020-02-03 ENCOUNTER — Other Ambulatory Visit: Payer: 59

## 2020-02-07 ENCOUNTER — Inpatient Hospital Stay: Payer: 59 | Attending: Internal Medicine | Admitting: Internal Medicine

## 2020-02-07 ENCOUNTER — Other Ambulatory Visit: Payer: Self-pay

## 2020-02-07 VITALS — BP 105/69 | HR 85 | Temp 98.7°F | Resp 18 | Ht 69.0 in | Wt 168.5 lb

## 2020-02-07 DIAGNOSIS — R200 Anesthesia of skin: Secondary | ICD-10-CM | POA: Diagnosis not present

## 2020-02-07 DIAGNOSIS — Z856 Personal history of leukemia: Secondary | ICD-10-CM | POA: Insufficient documentation

## 2020-02-07 DIAGNOSIS — D320 Benign neoplasm of cerebral meninges: Secondary | ICD-10-CM | POA: Diagnosis not present

## 2020-02-07 DIAGNOSIS — D329 Benign neoplasm of meninges, unspecified: Secondary | ICD-10-CM

## 2020-02-07 NOTE — Progress Notes (Signed)
[facility] at [place], [place] [address] [phone]   Interval Evaluation  Date of Service: 02/07/20 Patient Name: John Glover Patient MRN: [id number] Patient DOB: Feb 19, 1961 Provider: Ventura Sellers, MD  Identifying Statement:  Pabel Ciolli is a 59 y.o. male with atypical skull base meningioma   Interval History:  Yorman Crase presents today for follow up after recent MRI.  He describes no new or progressive neurologic deficits.  Continues to have mild left lower face numbness but no double vision.  He is functional and independent, working full time in Engineer, mining despite coronavirus shutdowns.    Medications: Current Outpatient Medications on File Prior to Visit  Medication Sig Dispense Refill  . Alogliptin Benzoate 25 MG TABS Take 1 tablet by mouth daily.    Marland Kitchen atorvastatin (LIPITOR) 20 MG tablet Take 20 mg by mouth daily.  5  . metFORMIN (GLUCOPHAGE) 1000 MG tablet Take 1,000 mg by mouth 2 (two) times daily with a meal.    . sildenafil (REVATIO) 20 MG tablet Take 1 tablet by mouth as needed.     No current facility-administered medications on file prior to visit.    Allergies: No Known Allergies Past Medical History:  Past Medical History:  Diagnosis Date  . H/O leukemia   . Meningioma [facility])    Past Surgical History:  Past Surgical History:  Procedure Laterality Date  . CRANIOTOMY     Social History:  Social History   Socioeconomic History  . Marital status: Married    Spouse name: Not on file  . Number of children: Not on file  . Years of education: Not on file  . Highest education level: Not on file  Occupational History  . Not on file  Tobacco Use  . Smoking status: Never Smoker  . Smokeless tobacco: Never Used  Substance and Sexual Activity  . Alcohol use: No  . Drug use: No  . Sexual activity: Yes  Other Topics Concern  . Not on file  Social History Narrative  . Not on file   Social  Determinants of Health   Financial Resource Strain:   . Difficulty of Paying Living Expenses:   Food Insecurity:   . Worried About Charity fundraiser in the Last Year:   . Arboriculturist in the Last Year:   Transportation Needs:   . Film/video editor (Medical):   Marland Kitchen Lack of Transportation (Non-Medical):   Physical Activity:   . Days of Exercise per Week:   . Minutes of Exercise per Session:   Stress:   . Feeling of Stress :   Social Connections:   . Frequency of Communication with Friends and Family:   . Frequency of Social Gatherings with Friends and Family:   . Attends Religious Services:   . Active Member of Clubs or Organizations:   . Attends Archivist Meetings:   Marland Kitchen Marital Status:   Intimate Partner Violence:   . Fear of Current or Ex-Partner:   . Emotionally Abused:   Marland Kitchen Physically Abused:   . Sexually Abused:    Family History:  Family History  Problem Relation Age of Onset  . Diabetes Mother     Review of Systems: Constitutional: Denies fevers, chills or abnormal weight loss Eyes: Denies blurriness of vision Ears, nose, mouth, throat, and face: Denies mucositis or sore throat Respiratory: Denies cough, dyspnea or wheezes Cardiovascular: Denies palpitation, chest discomfort or lower extremity swelling Gastrointestinal:  Denies nausea, constipation, diarrhea GU: Denies dysuria or incontinence Skin: Denies abnormal skin rashes Neurological: Per HPI Musculoskeletal: Denies joint pain, back or neck discomfort. No decrease in ROM Behavioral/Psych: Denies anxiety, disturbance in thought content, and mood instability  Physical Exam: Vitals:   02/07/20 0848  BP: 105/69  Pulse: 85  Resp: 18  Temp: 98.7 F (37.1 C)  SpO2: 100%   KPS: 100. General: Alert, cooperative, pleasant, in no acute distress Head: Normal EENT: No conjunctival injection or scleral icterus. Oral mucosa moist Lungs: Resp effort normal Cardiac: Regular rate and  rhythm Abdomen: Soft, non-distended abdomen Skin: No rashes cyanosis or petechiae. Extremities: No clubbing or edema  Neurologic Exam: Mental Status: Awake, alert, attentive to examiner. Oriented to self and environment. Language is fluent with intact comprehension.  Cranial Nerves: Visual acuity is grossly normal. Visual fields are full. Extra-ocular movements intact. No ptosis. Face is symmetric, tongue midline. Motor: Tone and bulk are normal. Power is full in both arms and legs. Reflexes are symmetric, no pathologic reflexes present. Intact finger to nose bilaterally Sensory: Intact to light touch and temperature Gait: Normal and tandem gait is normal.   Labs: I have reviewed the data as listed    Component Value Date/Time   BUN 15.6 10/16/2015 0804   CREATININE 1.1 10/16/2015 0804   No results found for: WBC, NEUTROABS, HGB, HCT, MCV, PLT  Imaging: [facility] Clinician Interpretation: I have personally reviewed the CNS images as listed.  My interpretation, in the context of the patient's clinical presentation, is stable disease  MR Brain W Wo Contrast  Result Date: 01/26/2020 CLINICAL DATA:  Follow-up brain tumor removed in 2005. History of radiation therapy. EXAM: MRI HEAD WITHOUT AND WITH CONTRAST TECHNIQUE: Multiplanar, multiecho pulse sequences of the brain and surrounding structures were obtained without and with intravenous contrast. CONTRAST:  43mL MULTIHANCE GADOBENATE DIMEGLUMINE 529 MG/ML IV SOLN COMPARISON:  05/02/2019 FINDINGS: Brain: Sphenoid wing meningioma on the left status post debulking. There is residual tumor with shape limiting reproducible measurement. The extent and shape of tumor is unchanged however, seen at the level of the left sphenoid/ethmoid sinuses, left cavernous sinus, on both sides of the postoperative superolateral left orbit, and left infratemporal fossa. Dural thickening along the floor of the left middle cranial fossa is also stable in could be  combination of reactive thickening and plaque-like tumor. Left Meckel's cave is effaced. No new abnormality. No infarct, hemorrhage, hydrocephalus, or collection. Cortically based encephalomalacia in the frontal lobes is unchanged. Small remote micro hemorrhages, possibly posttraumatic. Vascular: Normal flow voids and vascular enhancements. Skull and upper cervical spine: Unremarkable left-sided craniotomy and lateral orbital surgery. Sinuses/Orbits: Tumor and left-sided sinuses at noted above. Endoscopic sinus surgery with widely patent maxillary ostomies. Postoperative lateral left orbit with stable proptosis. IMPRESSION: Treated left sphenoid wing meningioma with unchanged residual tumor. Electronically Signed   By: Monte Fantasia M.D.   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 Feliu is clinically and radiographically stable today.  At this time we recommend repeat MRI brain in 1 year for evaluation given prior recurrence extent of remaining bulky disease.  We appreciate the opportunity to participate in the care of [facility].   All questions were answered. The patient knows to call the clinic with any problems, questions or concerns. No barriers to learning were detected.  The total time spent in the encounter was 30 minutes and more than 50% was on  counseling and review of test results   Ventura Sellers, MD Medical Director of Neuro-Oncology [facility] at [place] 02/07/20 9:05 AM

## 2020-02-08 ENCOUNTER — Telehealth: Payer: Self-pay | Admitting: Internal Medicine

## 2020-02-08 NOTE — Telephone Encounter (Signed)
Scheduled appt per 4/27 los.  Left a vm of there appt date and time.

## 2020-12-28 ENCOUNTER — Other Ambulatory Visit: Payer: Self-pay | Admitting: Radiation Therapy

## 2021-01-28 ENCOUNTER — Other Ambulatory Visit: Payer: Self-pay

## 2021-01-28 ENCOUNTER — Ambulatory Visit
Admission: RE | Admit: 2021-01-28 | Discharge: 2021-01-28 | Disposition: A | Discharge status: Home / Self Care | Payer: 59 | Source: Ambulatory Visit | Attending: Internal Medicine | Admitting: Internal Medicine

## 2021-01-28 DIAGNOSIS — D329 Benign neoplasm of meninges, unspecified: Secondary | ICD-10-CM

## 2021-01-28 MED ORDER — GADOBENATE DIMEGLUMINE 529 MG/ML IV SOLN
15.0000 mL | Freq: Once | INTRAVENOUS | Status: AC | PRN
  Administered 2021-01-28: 15 mL via INTRAVENOUS

## 2021-01-29 ENCOUNTER — Telehealth: Payer: Self-pay | Admitting: *Deleted

## 2021-01-29 ENCOUNTER — Inpatient Hospital Stay: Payer: 59 | Attending: Internal Medicine | Admitting: Internal Medicine

## 2021-01-29 NOTE — Telephone Encounter (Signed)
Attempted to reach patient to reschedule missed appointment.  Dr Mickeal Skinner is agreeable to do a phone visit when he reschedules.

## 2021-01-31 ENCOUNTER — Telehealth: Payer: Self-pay | Admitting: Internal Medicine

## 2021-01-31 NOTE — Telephone Encounter (Signed)
Scheduled appt per 4/21 sch msg. Called pt, no answer. Left msg with appt date and time.  

## 2021-02-05 ENCOUNTER — Inpatient Hospital Stay (HOSPITAL_BASED_OUTPATIENT_CLINIC_OR_DEPARTMENT_OTHER): Payer: 59 | Admitting: Internal Medicine

## 2021-02-05 DIAGNOSIS — D329 Benign neoplasm of meninges, unspecified: Secondary | ICD-10-CM

## 2021-02-05 NOTE — Progress Notes (Signed)
I connected with John Glover on 02/05/21 at  9:00 AM EDT by telephone visit and verified that I am speaking with the correct person using two identifiers.  I discussed the limitations, risks, security and privacy concerns of performing an evaluation and management service by telemedicine and the availability of in-person appointments. I also discussed with the patient that there may be a patient responsible charge related to this service. The patient expressed understanding and agreed to proceed.  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  He denies headaches, seizures.  Continues to work and function independently without restriction. 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  My interpretation, in the context of the patient's clinical presentation, is stable disease  MR BRAIN W WO CONTRAST  Result Date: 01/28/2021 CLINICAL DATA:  Follow-up meningioma resected in 2005. History of radiation therapy. EXAM: MRI HEAD WITHOUT AND WITH CONTRAST TECHNIQUE: Multiplanar, multiecho pulse sequences of the brain and surrounding structures were obtained without and with intravenous contrast. CONTRAST:  61mL MULTIHANCE GADOBENATE DIMEGLUMINE 529 MG/ML IV SOLN COMPARISON:  01/25/2020 FINDINGS: Brain: Sequelae of left sphenoid wing meningioma debulking are again identified. Residual enhancing tumor involving the left cavernous sinus, left sphenoid sinus, posterior left ethmoid sinus, posterolateral left orbit, and left infratemporal fossa does not appear  significantly changed in volume or extent. Dural thickening along the floor of the left middle cranial fossa is unchanged and may be postoperative, residual tumor, or a combination. Enhancing soft tissue also encroaches upon left Meckel's cave. No acute infarct, new intracranial mass, or extra-axial fluid collection is identified. Small areas of encephalomalacia anteriorly in both frontal lobes are unchanged. Scattered chronic bilateral cerebral microhemorrhages have also not significantly changed allowing for differences in field strength between the current and prior studies. The ventricles are normal in size. Vascular: Major intracranial vascular flow voids are preserved. Skull and upper cervical spine: Prior left-sided craniotomy. Unchanged chronic marrow replacement in the clivus. Sinuses/Orbits: Postoperative changes to the left orbit with unchanged exophthalmos. Postoperative changes to the paranasal sinuses with left sphenoid and ethmoid sinus tumor as noted above. Clear mastoid air cells. Other: None. IMPRESSION: Unchanged residual left skull base meningioma. Electronically Signed   By: Logan Bores M.D.   On: 01/28/2021 11:20   Clinically and radiographically stable today  Follow Up Instructions: We ask that Trequan Marsolek return to clinic in 12 months following next brain MRI, or sooner as needed.  I discussed the assessment and treatment plan with the patient.  The patient was provided an opportunity to ask questions and all were answered.  The patient agreed with the plan and demonstrated understanding of the instructions.    The patient was advised to call back or seek an in-person evaluation if the symptoms worsen or if the condition fails to improve as anticipated.    Ventura Sellers, MD   I provided 15 minutes of non face-to-face telephone visit time during this encounter, and > 50% was spent counseling as documented under my assessment & plan.

## 2021-02-06 ENCOUNTER — Telehealth: Payer: Self-pay | Admitting: Internal Medicine

## 2021-02-06 NOTE — Telephone Encounter (Signed)
Left message with follow-up appointment per 4/26 los. Gave option to call back to reschedule if needed. 

## 2022-01-02 ENCOUNTER — Other Ambulatory Visit: Payer: Self-pay | Admitting: Radiation Therapy

## 2022-01-31 ENCOUNTER — Ambulatory Visit
Admission: RE | Admit: 2022-01-31 | Discharge: 2022-01-31 | Disposition: A | Discharge status: Home / Self Care | Payer: BC Managed Care – PPO | Source: Ambulatory Visit | Attending: Internal Medicine | Admitting: Internal Medicine

## 2022-01-31 DIAGNOSIS — D329 Benign neoplasm of meninges, unspecified: Secondary | ICD-10-CM

## 2022-01-31 MED ORDER — GADOBENATE DIMEGLUMINE 529 MG/ML IV SOLN
17.0000 mL | Freq: Once | INTRAVENOUS | Status: AC | PRN
Start: 2022-01-31 — End: 2022-01-31
  Administered 2022-01-31: 17 mL via INTRAVENOUS

## 2022-01-31 MED ORDER — GADOBENATE DIMEGLUMINE 529 MG/ML IV SOLN: 17.0000 mL | Freq: Once | INTRAVENOUS | Status: DC | PRN

## 2022-02-04 ENCOUNTER — Inpatient Hospital Stay: Payer: BC Managed Care – PPO | Attending: Internal Medicine | Admitting: Internal Medicine

## 2022-02-04 ENCOUNTER — Other Ambulatory Visit: Payer: Self-pay

## 2022-02-04 VITALS — BP 106/74 | HR 74 | Temp 97.5°F | Resp 15 | Wt 174.1 lb

## 2022-02-04 DIAGNOSIS — D320 Benign neoplasm of cerebral meninges: Secondary | ICD-10-CM | POA: Insufficient documentation

## 2022-02-04 DIAGNOSIS — Z856 Personal history of leukemia: Secondary | ICD-10-CM | POA: Diagnosis not present

## 2022-02-04 DIAGNOSIS — D329 Benign neoplasm of meninges, unspecified: Secondary | ICD-10-CM | POA: Diagnosis not present

## 2022-02-04 NOTE — Progress Notes (Signed)
? ?[facility] [address]  ?[place], [place] [address] ?[phone] ? ? ?Interval Evaluation ? ?Date of Service: 02/04/22 ?Patient Name: John Glover ?Patient MRN: [id number] ?Patient DOB: 1960-12-28 ?Provider: Ventura Sellers, MD ? ?Identifying Statement:  ?John Glover is a 61 y.o. male with atypical  skull base  meningioma  ? ?Interval History: ? John Glover presents today for follow up after recent MRI.  No new or progressive changes today.  Continues to have mild left lower face numbness but no double vision.  He is functional and independent, working full time now in [place].  ? ?H+P (10/24/17) Patient initially presented to medical attention in 1995 after experiencing "bulging" of his left eye, which prompted an MRI brain demonstrating a skull base meningioma.  This was resected in [place], where he lived at the time.  Unfortunately no images are available for review from this period.  The lesion was followed serially until progression/growth was noted in 2014.  Ultimately, there was no surgical option 2/2 tumor location, and IMRT radiation was performed by Dr. Tammi Klippel ending 12/13/15.  He tolerated this treatment well with only some mild worsening of proptosis.  At baseline he has mild sensory impairment in the left lower face only.  He is functional and independent, working full time in Engineer, mining.  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 ? ?Medications: ?Current Outpatient Medications on File Prior to Visit  ?Medication Sig Dispense Refill  ? Alogliptin Benzoate 25 MG TABS Take 1 tablet by mouth daily.    ? atorvastatin (LIPITOR) 20 MG tablet Take 20 mg by mouth daily.  5  ? metFORMIN (GLUCOPHAGE) 1000 MG tablet Take 1,000 mg by mouth 2 (two) times daily with a meal.    ? sildenafil (REVATIO) 20 MG tablet Take 1 tablet by mouth as needed.    ? ?No current facility-administered medications on file prior to visit.   ? ? ?Allergies: No Known Allergies ?Past Medical History:  ?Past Medical History:  ?Diagnosis Date  ? H/O leukemia   ? Meningioma ([place])   ? ?Past Surgical History:  ?Past Surgical History:  ?Procedure Laterality Date  ? CRANIOTOMY    ? ?Social History:  ?Social History  ? ?Socioeconomic History  ? Marital status: Married  ?  Spouse name: Not on file  ? Number of children: Not on file  ? Years of education: Not on file  ? Highest education level: Not on file  ?Occupational History  ? Not on file  ?Tobacco Use  ? Smoking status: Never  ? Smokeless tobacco: Never  ?Vaping Use  ? Vaping Use: Never used  ?Substance and Sexual Activity  ? Alcohol use: No  ? Drug use: No  ? Sexual activity: Yes  ?Other Topics Concern  ? Not on file  ?Social History Narrative  ? Not on file  ? ?Social Determinants of Health  ? ?Financial Resource Strain: Not on file  ?Food Insecurity: Not on file  ?Transportation Needs: Not on file  ?Physical Activity: Not on file  ?Stress: Not on file  ?Social Connections: Not on file  ?Intimate Partner Violence: Not on file  ? ?Family History:  ?Family History  ?Problem Relation Age of Onset  ? Diabetes Mother   ? ? ?Review of Systems: ?Constitutional: Denies fevers, chills or abnormal weight loss ?Eyes: Denies blurriness of vision ?Ears, nose, mouth, throat, and face: Denies mucositis or sore throat ?  Respiratory: Denies cough, dyspnea or wheezes ?Cardiovascular: Denies palpitation, chest discomfort or lower extremity swelling ?Gastrointestinal:  Denies nausea, constipation, diarrhea ?GU: Denies dysuria or incontinence ?Skin: Denies abnormal skin rashes ?Neurological: Per HPI ?Musculoskeletal: Denies joint pain, back or neck discomfort. No decrease in ROM ?Behavioral/Psych: Denies anxiety, disturbance in thought content, and mood instability ? ?Physical Exam: ?Vitals:  ? 02/04/22 0952  ?BP: 106/74  ?Pulse: 74  ?Resp: 15  ?Temp: (!) 97.5 ?F (36.4 ?C)  ?SpO2: 100%  ? ?KPS: 100. ?General: Alert,  cooperative, pleasant, in no acute distress ?Head: Normal ?EENT: No conjunctival injection or scleral icterus. Oral mucosa moist ?Lungs: Resp effort normal ?Cardiac: Regular rate and rhythm ?Abdomen: Soft, non-distended abdomen ?Skin: No rashes cyanosis or petechiae. ?Extremities: No clubbing or edema ? ?Neurologic Exam: ?Mental Status: Awake, alert, attentive to examiner. Oriented to self and environment. Language is fluent with intact comprehension.  ?Cranial Nerves: Visual acuity is grossly normal. Visual fields are full. Extra-ocular movements intact. No ptosis. Face is symmetric, tongue midline. ?Motor: Tone and bulk are normal. Power is full in both arms and legs. Reflexes are symmetric, no pathologic reflexes present. Intact finger to nose bilaterally ?Sensory: Intact to light touch and temperature ?Gait: Normal and tandem gait is normal. ? ? ?Labs: ?I have reviewed the data as listed ?   ?Component Value Date/Time  ? BUN 15.6 10/16/2015 0804  ? CREATININE 1.1 10/16/2015 0804  ? ?No results found for: WBC, NEUTROABS, HGB, HCT, MCV, PLT ? ?Imaging: ?[facility] Clinician Interpretation: I have personally reviewed the CNS images as listed.  My interpretation, in the context of the patient's clinical presentation, is stable disease ? ?MR BRAIN W WO CONTRAST ? ?Result Date: 01/31/2022 ?CLINICAL DATA:  Brain/CNS neoplasm, assess treatment response; left sphenoid meningioma follow-up EXAM: MRI HEAD WITHOUT AND WITH CONTRAST TECHNIQUE: Multiplanar, multiecho pulse sequences of the brain and surrounding structures were obtained without and with intravenous contrast. CONTRAST:  75m MULTIHANCE GADOBENATE DIMEGLUMINE 529 MG/ML IV SOLN COMPARISON:  April 2022 FINDINGS: Brain: Residual enhancing tumor is again identified involving the left middle cranial fossa, cavernous sinus, sphenoid sinus, posterolateral orbit, and infratemporal fossa. Adjacent dural thickening deep to the craniotomy may reflect a combination of  postoperative changes and residual tumor. As before, enhancing tumor also encroaches on the left trigeminal cave. There is no acute infarction or intracranial hemorrhage. Ventricles and sulci are within normal limits in size and configuration. Small areas of encephalomalacia the anterior frontal lobes bilaterally. Similar scattered foci of susceptibility likely reflecting chronic microhemorrhages. Vascular: Major vessel flow voids at the skull base are preserved. Skull and upper cervical spine: Prior left orbitofrontal craniotomy normal marrow signal is preserved. Sinuses/Orbits: Evidence of prior sinonasal surgery. Lobular right maxillary sinus mucosal thickening left sphenoid tumor as described above. Orbits are unremarkable. Other: Sella is unremarkable.  Mastoid air cells are clear. IMPRESSION: Stable extent of residual meningioma.  No new findings. Electronically Signed   By: PMacy MisM.D.   On: 01/31/2022 13:14   ? ? 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  ?John Glover is clinically and radiographically stable today. ? ?We ask that John Whitfordreturn to clinic in 2 years following next brain MRI, or sooner as needed. ? ?We appreciate the opportunity to [facility]  ? ?All questions were answered. The patient knows to call the clinic with any problems, questions or concerns. No barriers to learning were detected. ? ?The total time spent  in the encounter was 30 minutes and more than 50% was on counseling and review of test results ? ? ?Ventura Sellers, MD ?Medical Director of Neuro-Oncology ?[facility] ?02/04/22 10:01 AM ?

## 2023-10-08 ENCOUNTER — Other Ambulatory Visit: Payer: Self-pay

## 2023-10-08 ENCOUNTER — Telehealth: Payer: Self-pay | Admitting: *Deleted

## 2023-10-08 NOTE — Telephone Encounter (Signed)
Returned PC to patient, informed him of Dr Liana Gerold recommendation to [employer], [phone].  Patient verbalizes understanding.

## 2023-10-08 NOTE — Telephone Encounter (Signed)
-----   Message from Henreitta Leber sent at 10/08/2023  1:22 PM EST ----- Regarding: RE: Optometrist Usually refer to [facility] ----- Message ----- From: Arville Care, RN Sent: 10/08/2023  10:44 AM EST To: Henreitta Leber, MD Subject: Optometrist                                    This patient called, he last saw you in 01/2022 & he said you recommended he see an optometrist but he doesn't remember if you suggested a particular one.  He has floaters in his L eye, says he sees "fuzzy things no matter how I move my eye."  Let me know if there is a particular optometrist you recommend for him.  Darel Hong

## 2023-10-09 LAB — HM DIABETES EYE EXAM

## 2023-12-22 ENCOUNTER — Other Ambulatory Visit: Payer: Self-pay | Admitting: *Deleted

## 2023-12-22 DIAGNOSIS — D329 Benign neoplasm of meninges, unspecified: Secondary | ICD-10-CM

## 2024-01-13 ENCOUNTER — Telehealth: Payer: Self-pay | Admitting: Internal Medicine

## 2024-01-13 NOTE — Telephone Encounter (Signed)
 Patient scheduled appointments. Patient is aware of all appointment details.

## 2024-01-23 DIAGNOSIS — Z419 Encounter for procedure for purposes other than remedying health state, unspecified: Secondary | ICD-10-CM | POA: Diagnosis not present

## 2024-02-04 ENCOUNTER — Ambulatory Visit
Admission: RE | Admit: 2024-02-04 | Discharge: 2024-02-04 | Disposition: A | Discharge status: Home / Self Care | Source: Ambulatory Visit | Attending: Internal Medicine | Admitting: Internal Medicine

## 2024-02-04 DIAGNOSIS — D329 Benign neoplasm of meninges, unspecified: Secondary | ICD-10-CM

## 2024-02-04 MED ORDER — GADOPICLENOL 0.5 MMOL/ML IV SOLN
8.0000 mL | Freq: Once | INTRAVENOUS | Status: AC | PRN
Start: 2024-02-04 — End: 2024-02-04
  Administered 2024-02-04: 8 mL via INTRAVENOUS

## 2024-02-11 ENCOUNTER — Inpatient Hospital Stay: Attending: Internal Medicine | Admitting: Internal Medicine

## 2024-02-11 VITALS — BP 107/80 | HR 82 | Temp 98.0°F | Resp 17 | Wt 176.3 lb

## 2024-02-11 DIAGNOSIS — Z806 Family history of leukemia: Secondary | ICD-10-CM | POA: Diagnosis not present

## 2024-02-11 DIAGNOSIS — D329 Benign neoplasm of meninges, unspecified: Secondary | ICD-10-CM | POA: Diagnosis not present

## 2024-02-11 NOTE — Progress Notes (Signed)
 [facility] [address]  [place], [place] [address] [phone]   Interval Evaluation  Date of Service: 02/11/24 Patient Name: John Glover Patient MRN: [id number] Patient DOB: [birth year] Provider: Mamie Searles, MD  Identifying Statement:  John Glover is a 63 y.o. male with atypical  skull base  meningioma   CNS Oncologic History 12/13/15: Completes IMRT to skull base meningioma with Dr. Lorri Rota  Interval History:  John Glover presents today for follow up after recent MRI.  Denies new or progressive neurologic symptoms.  Continues to have mild left lower face numbness but no double vision.  He is functional and independent, currently out of work.   H+P (10/24/17) Patient initially presented to medical attention in 1995 after experiencing "bulging" of his left eye, which prompted an MRI brain demonstrating a skull base meningioma.  This was resected in [place] , where he lived at the time.  Unfortunately no images are available for review from this period.  The lesion was followed serially until progression/growth was noted in 2014.  Ultimately, there was no surgical option 2/2 tumor location, and IMRT radiation was performed by Dr. Lorri Rota ending 12/13/15.  He tolerated this treatment well with only some mild worsening of proptosis.  At baseline he has mild sensory impairment in the left lower face only.  He is functional and independent, working full time in Education officer, environmental.  He has noted history of PCI irradiation at age 77 as part of therapy for ALL leukemia.  Medications: Current Outpatient Medications on File Prior to Visit  Medication Sig Dispense Refill   Alogliptin Benzoate 25 MG TABS Take 1 tablet by mouth daily.     atorvastatin (LIPITOR) 20 MG tablet Take 20 mg by mouth daily.  5   metFORMIN (GLUCOPHAGE) 1000 MG tablet Take 1,000 mg by mouth 2 (two) times daily with a meal.     sildenafil (REVATIO) 20 MG tablet Take 1 tablet by mouth as  needed.     No current facility-administered medications on file prior to visit.    Allergies: No Known Allergies Past Medical History:  Past Medical History:  Diagnosis Date   H/O leukemia    Meningioma ([facility])    Past Surgical History:  Past Surgical History:  Procedure Laterality Date   CRANIOTOMY     Social History:  Social History   Socioeconomic History   Marital status: Married    Spouse name: Not on file   Number of children: Not on file   Years of education: Not on file   Highest education level: Not on file  Occupational History   Not on file  Tobacco Use   Smoking status: Never   Smokeless tobacco: Never  Vaping Use   Vaping status: Never Used  Substance and Sexual Activity   Alcohol use: No   Drug use: No   Sexual activity: Yes  Other Topics Concern   Not on file  Social History Narrative   Not on file   Social Drivers of Health   Financial Resource Strain: Low Risk  (12/01/2022)   Received from [facility]   Overall Financial Resource Strain (CARDIA)    Difficulty of Paying Living Expenses: Not hard at all  Food Insecurity: No Food Insecurity (12/01/2022)   [facility] from [facility]   Hunger Vital Sign    Worried About Running Out of Food in the Last Year: Never true    Ran Out of Food in the Last Year: Never true  Transportation Needs: No Transportation Needs (12/01/2022)   Received from [facility]   PRAPARE - Transportation    Lack of Transportation (Medical): No    Lack of Transportation (Non-Medical): No  Physical Activity: Not on file  Stress: Not on file  Social Connections: Unknown (06/28/2023)   Received from [facility]   Social Network    Social Network: Not on file  Intimate Partner Violence: Unknown (06/28/2023)   Received from [facility]   HITS    Physically Hurt: Not on file    Insult or Talk Down To: Not on file    Threaten Physical Harm: Not on file    Scream or Curse: Not on file   Family History:  Family  History  Problem Relation Age of Onset   Diabetes Mother     Review of Systems: Constitutional: Denies fevers, chills or abnormal weight loss Eyes: Denies blurriness of vision Ears, nose, mouth, throat, and face: Denies mucositis or sore throat Respiratory: Denies cough, dyspnea or wheezes Cardiovascular: Denies palpitation, chest discomfort or lower extremity swelling Gastrointestinal:  Denies nausea, constipation, diarrhea GU: Denies dysuria or incontinence Skin: Denies abnormal skin rashes Neurological: Per HPI Musculoskeletal: Denies joint pain, back or neck discomfort. No decrease in ROM Behavioral/Psych: Denies anxiety, disturbance in thought content, and mood instability  Physical Exam: There were no vitals filed for this visit.  KPS: 100. General: Alert, cooperative, pleasant, in no acute distress Head: Normal EENT: No conjunctival injection or scleral icterus. Oral mucosa moist Lungs: Resp effort normal Cardiac: Regular rate and rhythm Abdomen: Soft, non-distended abdomen Skin: No rashes cyanosis or petechiae. Extremities: No clubbing or edema  Neurologic Exam: Mental Status: Awake, alert, attentive to examiner. Oriented to self and environment. Language is fluent with intact comprehension.  Cranial Nerves: Visual acuity is grossly normal. Visual fields are full. Extra-ocular movements intact. No ptosis. Face is symmetric, tongue midline. Motor: Tone and bulk are normal. Power is full in both arms and legs. Reflexes are symmetric, no pathologic reflexes present. Intact finger to nose bilaterally Sensory: Intact to light touch and temperature Gait: Normal and tandem gait is normal.   Labs: I have reviewed the data as listed    Component Value Date/Time   BUN 15.6 10/16/2015 0804   CREATININE 1.1 10/16/2015 0804   No results found for: "WBC", "NEUTROABS", "HGB", "HCT", "MCV", "PLT"  Imaging: [facility] Clinician Interpretation: I have personally reviewed the CNS  images as listed.  My interpretation, in the context of the patient's clinical presentation, is stable disease  MR Brain W Wo Contrast Result Date: 02/10/2024 CLINICAL DATA:  63 year old male with left sphenoid wing meningioma involving the cavernous sinus. Initial treatment in 1985, radiation treatment in 2017. Restaging. EXAM: MRI HEAD WITHOUT AND WITH CONTRAST TECHNIQUE: Multiplanar, multiecho pulse sequences of the brain and surrounding structures were obtained without and with intravenous contrast. CONTRAST:  8 mL [place]  COMPARISON:  01/11/2022 and earlier. FINDINGS: Brain: Chronic postoperative changes along the left face and skull, anterior and middle cranial fossa including the region of the left cavernous sinus. Plaque-like and nodular infratemporal fossa remains stable and size and configuration, and has not progressed when compared to 04/23/2018 MRI. The lesion is contiguous with the left cavernous sinus as before. But the sella turcica remains patent with chronic partially empty sella appearance. No significant regional mass effect. No associated cerebral edema. Mild chronic encephalomalacia at the anterior frontal lobes greater on the left, relatively sparing the inferior frontal gyri. No superimposed restricted diffusion to  suggest acute infarction. No midline shift, ventriculomegaly, or acute intracranial hemorrhage. Cervicomedullary junction is within normal limits. No new areas of abnormal intracranial enhancement. Fairly numerous scattered foci of chronic microhemorrhage in the brain, including in both hemispheres well removed from the treatment area (e.g. Series 16, image 55) show mild progression since 2022 (such as in the left thalamus) and are likely small vessel disease related. These do not yet rise to the level of amyloid angiopathy. Mild progression of nonspecific white matter T2 and FLAIR hyperintensity also since 2023 in the posterior right corona radiata bordering the basal ganglia  (series 112, image 21). Vascular: Major intracranial vascular flow voids are stable since 2023. Following contrast major dural venous sinuses not chronically involved by the meningioma are enhancing and appear to be patent. Skull and upper cervical spine: Stable post craniotomy changes. Bone marrow signal is stable. Negative visible cervical spine and spinal cord. Sinuses/Orbits: Orbits soft tissues appears stable since 2019 also, including chronic architectural distortion at the left orbital apex. Chronic left side exophthalmos. Stable paranasal sinuses, with chronic left sphenoid tumor involvement. Other: Mastoids remain clear. Visible internal auditory structures appear normal. Stable visible scalp and face. IMPRESSION: 1. Extensive trans-spatial left skull base Meningioma remains stable. 2. No superimposed acute intracranial abnormality, but some progression of signal changes suggesting small-vessel disease since 2023, notable for numerous chronic microhemorrhages. The appearance does not yet rise to the level of amyloid angiopathy. Electronically Signed   By: Marlise Simpers M.D.   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  No new or progressive changes.  We ask that John Glover return to clinic in 2 years following next brain MRI, or sooner as needed.  We appreciate the opportunity to participate in the care of John Glover.   All questions were answered. The patient knows to call the clinic with any problems, questions or concerns. No barriers to learning were detected.  The total time spent in the encounter was 30 minutes and more than 50% was on counseling and review of test results   Mamie Searles, MD Medical Director of Neuro-Oncology [facility] 02/11/24 9:18 AM

## 2024-02-22 DIAGNOSIS — Z419 Encounter for procedure for purposes other than remedying health state, unspecified: Secondary | ICD-10-CM | POA: Diagnosis not present

## 2024-03-24 DIAGNOSIS — Z419 Encounter for procedure for purposes other than remedying health state, unspecified: Secondary | ICD-10-CM | POA: Diagnosis not present

## 2024-04-23 DIAGNOSIS — Z419 Encounter for procedure for purposes other than remedying health state, unspecified: Secondary | ICD-10-CM | POA: Diagnosis not present
